# Patient Record
Sex: Male | Born: 1985 | Race: Black or African American | Hispanic: No | Smoking: Never smoker
Health system: Southern US, Community
[De-identification: ages and names within clinical notes are randomized; demographics above are authoritative.]

## PROBLEM LIST (undated history)

## (undated) HISTORY — PX: OTHER SURGICAL HISTORY: SHX169

## (undated) HISTORY — PX: LEG SURGERY: SHX1003

---
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
## 2004-04-11 ENCOUNTER — Emergency Department (HOSPITAL_COMMUNITY): Admission: EM | Admit: 2004-04-11 | Discharge: 2004-04-11 | Payer: Self-pay | Admitting: Emergency Medicine

## 2005-04-03 ENCOUNTER — Emergency Department (HOSPITAL_COMMUNITY): Admission: EM | Admit: 2005-04-03 | Discharge: 2005-04-03 | Payer: Self-pay | Admitting: Emergency Medicine

## 2005-04-09 ENCOUNTER — Emergency Department (HOSPITAL_COMMUNITY): Admission: EM | Admit: 2005-04-09 | Discharge: 2005-04-09 | Payer: Self-pay | Admitting: Emergency Medicine

## 2005-06-10 ENCOUNTER — Emergency Department (HOSPITAL_COMMUNITY): Admission: EM | Admit: 2005-06-10 | Discharge: 2005-06-11 | Payer: Self-pay | Admitting: Emergency Medicine

## 2006-05-25 ENCOUNTER — Emergency Department (HOSPITAL_COMMUNITY): Admission: EM | Admit: 2006-05-25 | Discharge: 2006-05-25 | Payer: Self-pay | Admitting: Emergency Medicine

## 2008-02-19 ENCOUNTER — Emergency Department (HOSPITAL_COMMUNITY): Admission: EM | Admit: 2008-02-19 | Discharge: 2008-02-19 | Payer: Self-pay | Admitting: Emergency Medicine

## 2008-06-02 ENCOUNTER — Emergency Department (HOSPITAL_COMMUNITY): Admission: EM | Admit: 2008-06-02 | Discharge: 2008-06-02 | Payer: Self-pay | Admitting: Internal Medicine

## 2009-05-16 ENCOUNTER — Emergency Department (HOSPITAL_COMMUNITY): Admission: EM | Admit: 2009-05-16 | Discharge: 2009-05-16 | Payer: Self-pay | Admitting: Emergency Medicine

## 2009-07-06 ENCOUNTER — Emergency Department (HOSPITAL_COMMUNITY): Admission: EM | Admit: 2009-07-06 | Discharge: 2009-07-06 | Payer: Self-pay | Admitting: Emergency Medicine

## 2010-03-02 ENCOUNTER — Emergency Department (HOSPITAL_COMMUNITY)
Admission: EM | Admit: 2010-03-02 | Discharge: 2010-03-03 | Payer: Self-pay | Source: Home / Self Care | Admitting: Emergency Medicine

## 2010-04-17 ENCOUNTER — Emergency Department (HOSPITAL_COMMUNITY)
Admission: EM | Admit: 2010-04-17 | Discharge: 2010-04-17 | Payer: Self-pay | Source: Home / Self Care | Admitting: Emergency Medicine

## 2010-04-17 LAB — URINALYSIS, ROUTINE W REFLEX MICROSCOPIC
Ketones, ur: NEGATIVE mg/dL
Leukocytes, UA: NEGATIVE
Nitrite: NEGATIVE
Specific Gravity, Urine: 1.019 (ref 1.005–1.030)
Urine Glucose, Fasting: NEGATIVE mg/dL
Urobilinogen, UA: 1 mg/dL (ref 0.0–1.0)
pH: 6.5 (ref 5.0–8.0)

## 2010-04-17 LAB — DIFFERENTIAL
Eosinophils Relative: 4 % (ref 0–5)
Lymphocytes Relative: 40 % (ref 12–46)
Lymphs Abs: 1.3 10*3/uL (ref 0.7–4.0)
Monocytes Absolute: 0.3 10*3/uL (ref 0.1–1.0)
Neutrophils Relative %: 46 % (ref 43–77)

## 2010-04-17 LAB — COMPREHENSIVE METABOLIC PANEL
ALT: 21 U/L (ref 0–53)
AST: 22 U/L (ref 0–37)
Calcium: 9.4 mg/dL (ref 8.4–10.5)
Chloride: 101 mEq/L (ref 96–112)
GFR calc Af Amer: >60 mL/min (ref >60)
GFR calc non Af Amer: >60 mL/min (ref >60)
Potassium: 4 mEq/L (ref 3.5–5.1)
Sodium: 135 mEq/L (ref 135–145)
Total Bilirubin: 1 mg/dL (ref 0.3–1.2)

## 2010-04-17 LAB — CBC
Hemoglobin: 13.9 g/dL (ref 13.0–17.0)
MCHC: 32.6 g/dL (ref 30.0–36.0)
RDW: 14.1 % (ref 11.5–15.5)

## 2010-06-28 ENCOUNTER — Emergency Department (HOSPITAL_COMMUNITY)
Admission: EM | Admit: 2010-06-28 | Discharge: 2010-06-29 | Disposition: A | Discharge status: Home / Self Care | Payer: No Typology Code available for payment source | Attending: Emergency Medicine | Admitting: Emergency Medicine

## 2010-06-28 DIAGNOSIS — M545 Low back pain, unspecified: Secondary | ICD-10-CM | POA: Insufficient documentation

## 2010-06-28 DIAGNOSIS — S335XXA Sprain of ligaments of lumbar spine, initial encounter: Secondary | ICD-10-CM | POA: Insufficient documentation

## 2010-06-28 DIAGNOSIS — M5380 Other specified dorsopathies, site unspecified: Secondary | ICD-10-CM | POA: Insufficient documentation

## 2010-06-28 DIAGNOSIS — M546 Pain in thoracic spine: Secondary | ICD-10-CM | POA: Insufficient documentation

## 2010-06-29 LAB — URINALYSIS, ROUTINE W REFLEX MICROSCOPIC
Glucose, UA: NEGATIVE mg/dL
Hgb urine dipstick: NEGATIVE
Protein, ur: NEGATIVE mg/dL
Specific Gravity, Urine: 1.022 (ref 1.005–1.030)
Urobilinogen, UA: 1 mg/dL (ref 0.0–1.0)
pH: 7 (ref 5.0–8.0)

## 2010-06-30 LAB — RAPID STREP SCREEN (MED CTR MEBANE ONLY): Streptococcus, Group A Screen (Direct): NEGATIVE
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
## 2010-08-05 NOTE — Op Note (Signed)
NAMEROSHARD, REZABEK                        ACCOUNT NO.:  [id number]   MEDICAL RECORD NO.:  [id number]                   PATIENT TYPE:  INP   LOCATION:  5715                                 FACILITY:  [facility]   PHYSICIAN:  Mark C. Ophelia Charter, M.D.                 DATE OF BIRTH:  Apr 06, 1985   DATE OF PROCEDURE:  12/25/2001  DATE OF DISCHARGE:                                 OPERATIVE REPORT   PREOPERATIVE DIAGNOSES:  Right displaced midshaft radius fracture.   POSTOPERATIVE DIAGNOSES:  Right displaced midshaft radius fracture.   OPERATION PERFORMED:  Compression plating, right midshaft radius fracture.   SURGEON:  Mark C. Ophelia Charter, M.D.   ASSISTANT:  Genene Churn. Denton Meek.   ANESTHESIA:  General.   TOURNIQUET TIME:  51 minutes.   DESCRIPTION OF PROCEDURE:  After induction of general anesthesia,  orotracheal intubation, preoperative Ancef prophylaxis prepping all the way  to the fingertips up to the level of the proximal arm tourniquet.  Usual  impervious sheets, draped, impervious stockinet was applied.  Stockinet was  cut, Betadine Vi-drape applied.  Sterile skin marker was used and a volar  Sherilyn Cooter approach was made.  Interval adjacent to the radial artery was used.  Distally, the pronator was peeled off the bone from its insertion site.  The  superficial radial nerve was identified underneath the brachioradialis and  carefully protected.  Subperiosteal dissection of the proximal fragment was  performed and a Verbrugge clamp was used proximally and distally to hold the  bone, reduce the fracture and then apply a 6-hole Synthes compression  locking plate.  An 18 mm screw was used ________ cortical.  Compression  technique was used.  There was one tiny butterfly piece  at the level of the  fracture and there was one piece of bone that was trimmed slightly and then  impacted into place.  There was anatomic fixation of the proximal and distal  fragments.  Once small piece of bone  comminution was present back on the  dorsal side and was left adjacent to the fracture.  The fracture was  irrigated with saline solution removing hematoma prior to reduction and  plating.  Screw holes were filled.  No interfrag screw was necessary.  There  was anatomic positioning, excellent stability with rotation of the forearm  passively.  After irrigation with saline solution, the radial artery was  checked, tourniquet was deflated.  Small branches off the radial artery had  been cauterized with bipolar cautery.  Superficial radial nerve was intact.  Flexor carpi radialis was allowed to fall to its normal position as well as  the brachioradialis.  The subcutaneous tissue reapproximated with 2-0  Vicryl.  Skin staple closure, Marcaine infiltration of the skin.  Hemovac  drain was placed prior to closure and short arm splint was applied.  Instrument count and needle count was correct.  Tourniquet was deflated  prior  to closure.  Hemostasis was obtained.                                                 Mark C. Ophelia Charter, M.D.   MCY/MEDQ  D:  12/26/2001  T:  12/26/2001  Job:  [id number]

## 2010-11-05 ENCOUNTER — Inpatient Hospital Stay (INDEPENDENT_AMBULATORY_CARE_PROVIDER_SITE_OTHER): Admit: 2010-11-05 | Discharge: 2010-11-05 | Disposition: A | Discharge status: Home / Self Care | Payer: Self-pay

## 2010-11-05 ENCOUNTER — Emergency Department (HOSPITAL_COMMUNITY): Payer: Self-pay

## 2010-11-05 ENCOUNTER — Emergency Department (INDEPENDENT_AMBULATORY_CARE_PROVIDER_SITE_OTHER): Payer: Self-pay

## 2010-11-05 ENCOUNTER — Emergency Department (HOSPITAL_COMMUNITY)
Admission: EM | Admit: 2010-11-05 | Discharge: 2010-11-05 | Discharge status: Against Medical Advice | Payer: Self-pay | Attending: Emergency Medicine | Admitting: Emergency Medicine

## 2010-11-05 DIAGNOSIS — S0180XA Unspecified open wound of other part of head, initial encounter: Secondary | ICD-10-CM

## 2010-11-05 DIAGNOSIS — S1093XA Contusion of unspecified part of neck, initial encounter: Secondary | ICD-10-CM

## 2010-11-05 DIAGNOSIS — IMO0002 Reserved for concepts with insufficient information to code with codable children: Secondary | ICD-10-CM | POA: Insufficient documentation

## 2010-12-15 ENCOUNTER — Emergency Department (HOSPITAL_COMMUNITY)
Admission: EM | Admit: 2010-12-15 | Discharge: 2010-12-15 | Disposition: A | Discharge status: Home / Self Care | Payer: Medicaid Other | Attending: Emergency Medicine | Admitting: Emergency Medicine

## 2010-12-15 ENCOUNTER — Emergency Department (HOSPITAL_COMMUNITY): Payer: Medicaid Other

## 2010-12-15 DIAGNOSIS — M25529 Pain in unspecified elbow: Secondary | ICD-10-CM | POA: Insufficient documentation

## 2010-12-15 DIAGNOSIS — X58XXXA Exposure to other specified factors, initial encounter: Secondary | ICD-10-CM | POA: Insufficient documentation

## 2010-12-15 DIAGNOSIS — M25539 Pain in unspecified wrist: Secondary | ICD-10-CM | POA: Insufficient documentation

## 2010-12-15 DIAGNOSIS — M79609 Pain in unspecified limb: Secondary | ICD-10-CM | POA: Insufficient documentation

## 2010-12-15 DIAGNOSIS — M25519 Pain in unspecified shoulder: Secondary | ICD-10-CM | POA: Insufficient documentation

## 2010-12-15 DIAGNOSIS — S60229A Contusion of unspecified hand, initial encounter: Secondary | ICD-10-CM | POA: Insufficient documentation

## 2010-12-15 DIAGNOSIS — G40909 Epilepsy, unspecified, not intractable, without status epilepticus: Secondary | ICD-10-CM | POA: Insufficient documentation

## 2010-12-15 DIAGNOSIS — IMO0002 Reserved for concepts with insufficient information to code with codable children: Secondary | ICD-10-CM | POA: Insufficient documentation

## 2010-12-15 LAB — DIFFERENTIAL
Basophils Absolute: 0 10*3/uL (ref 0.0–0.1)
Basophils Relative: 0 % (ref 0–1)
Lymphocytes Relative: 23 % (ref 12–46)
Lymphs Abs: 1.2 10*3/uL (ref 0.7–4.0)
Monocytes Absolute: 0.3 10*3/uL (ref 0.1–1.0)
Monocytes Relative: 6 % (ref 3–12)

## 2010-12-15 LAB — POCT I-STAT, CHEM 8
BUN: 6 mg/dL (ref 6–23)
Calcium, Ion: 1.12 mmol/L (ref 1.12–1.32)
Chloride: 107 mEq/L (ref 96–112)
Creatinine, Ser: 1.4 mg/dL — ABNORMAL HIGH (ref 0.50–1.35)
Glucose, Bld: 94 mg/dL (ref 70–99)

## 2010-12-15 LAB — CBC
HCT: 37.4 % — ABNORMAL LOW (ref 39.0–52.0)
MCH: 30.1 pg (ref 26.0–34.0)
MCV: 87.4 fL (ref 78.0–100.0)
RBC: 4.28 MIL/uL (ref 4.22–5.81)

## 2011-04-12 ENCOUNTER — Emergency Department (HOSPITAL_COMMUNITY)
Admission: EM | Admit: 2011-04-12 | Discharge: 2011-04-12 | Disposition: A | Discharge status: Home / Self Care | Payer: Medicaid Other | Attending: Emergency Medicine | Admitting: Emergency Medicine

## 2011-04-12 ENCOUNTER — Encounter (HOSPITAL_COMMUNITY): Payer: Self-pay

## 2011-04-12 ENCOUNTER — Emergency Department (HOSPITAL_COMMUNITY): Payer: Medicaid Other

## 2011-04-12 DIAGNOSIS — S93409A Sprain of unspecified ligament of unspecified ankle, initial encounter: Secondary | ICD-10-CM | POA: Insufficient documentation

## 2011-04-12 DIAGNOSIS — S8391XA Sprain of unspecified site of right knee, initial encounter: Secondary | ICD-10-CM

## 2011-04-12 DIAGNOSIS — M79609 Pain in unspecified limb: Secondary | ICD-10-CM | POA: Insufficient documentation

## 2011-04-12 DIAGNOSIS — M25579 Pain in unspecified ankle and joints of unspecified foot: Secondary | ICD-10-CM | POA: Insufficient documentation

## 2011-04-12 DIAGNOSIS — IMO0002 Reserved for concepts with insufficient information to code with codable children: Secondary | ICD-10-CM | POA: Insufficient documentation

## 2011-04-12 DIAGNOSIS — R209 Unspecified disturbances of skin sensation: Secondary | ICD-10-CM | POA: Insufficient documentation

## 2011-04-12 DIAGNOSIS — S93401A Sprain of unspecified ligament of right ankle, initial encounter: Secondary | ICD-10-CM

## 2011-04-12 DIAGNOSIS — M25569 Pain in unspecified knee: Secondary | ICD-10-CM | POA: Insufficient documentation

## 2011-04-12 MED ORDER — HYDROCODONE-ACETAMINOPHEN 5-325 MG PO TABS
1.0000 | ORAL_TABLET | Freq: Once | ORAL | Status: AC
  Administered 2011-04-12: 1 via ORAL
  Filled 2011-04-12: qty 1

## 2011-04-12 MED ORDER — IBUPROFEN 800 MG PO TABS: 800.0000 mg | ORAL_TABLET | Freq: Three times a day (TID) | ORAL | Status: AC

## 2011-04-12 MED ORDER — HYDROCODONE-ACETAMINOPHEN 5-325 MG PO TABS: 1.0000 | ORAL_TABLET | ORAL | Status: AC | PRN

## 2011-04-12 NOTE — ED Notes (Signed)
Patient transported to X-ray 

## 2011-04-12 NOTE — ED Provider Notes (Signed)
Medical screening examination/treatment/procedure(s) were performed by non-physician practitioner and as supervising physician I was immediately available for consultation/collaboration.    Alanta Scobey R Denaja Verhoeven, MD 04/12/11 1700 

## 2011-04-12 NOTE — ED Notes (Signed)
Rt. Knee / leg pain pain due to kicking a dog that was attempting to bite pt.  No deformity or swelling noted

## 2011-04-12 NOTE — ED Provider Notes (Signed)
History     CSN: [id number]  Arrival date & time 04/12/11  1106   First MD Initiated Contact with Patient 04/12/11 1151      Chief Complaint  Patient presents with  . Leg Pain    (Consider location/radiation/quality/duration/timing/severity/associated sxs/prior treatment) HPI History provided by pt.   Pt was walking down the street today when a dog ran up to him and tried to bite him. He kicked the dog with his right foot and then stomped on it while it was on the ground.  Developed immediate pain in right ankle, lower leg and medial knee.  Aggravated by bearing weight.  Associated w/ numbness in great toe. Otherwise feeling well.  Has h/o right knee surgery.   History reviewed. No pertinent past medical history.  History reviewed. No pertinent past surgical history.  No family history on file.  History  Substance Use Topics  . Smoking status: Current Everyday Smoker  . Smokeless tobacco: Not on file  . Alcohol Use: Yes      Review of Systems  All other systems reviewed and are negative.    Allergies  Review of patient's allergies indicates no known allergies.  Home Medications  No current outpatient prescriptions on file.  BP 137/66  Pulse 77  Temp(Src) 97.8 F (36.6 C) (Oral)  Resp 20  Ht 5\' 7"  (1.702 m)  Wt 165 lb (74.844 kg)  BMI 25.84 kg/m2  SpO2 100%  Physical Exam  Nursing note and vitals reviewed. Constitutional: He is oriented to person, place, and time. He appears well-developed and well-nourished. No distress.  HENT:  Head: Normocephalic and atraumatic.  Eyes:       Normal appearance  Neck: Normal range of motion.  Musculoskeletal:       RLE w/out deformity, edema or ecchymosis.  Surgical scars on medial and anterior knee. Tenderness entire anterior knee as well as lateral/medial joint line; pt only appears uncomfortable when he is not distracted by conservation, and diffuse ankle tenderness.  No ttp of shaft of tibia/fibula.  Pain w/ passive  flexion of knee and any passive motion of ankle. Decreased sensation in great toe. 2+ DP pulse.   Neurological: He is alert and oriented to person, place, and time.  Psychiatric: He has a normal mood and affect. His behavior is normal.    ED Course  Procedures (including critical care time)  Labs Reviewed - No data to display Dg Ankle Complete Right  04/12/2011  *RADIOLOGY REPORT*  Clinical Data: Ankle pain.  RIGHT ANKLE - COMPLETE 3+ VIEW  Comparison: None.  Findings: No acute bony abnormality.  Specifically, no fracture, subluxation, or dislocation.  Soft tissues are intact.  IMPRESSION: No acute bony abnormality.  Original Report Authenticated By: Cyndie Chime, M.D.   Dg Knee Complete 4 Views Right  04/12/2011  *RADIOLOGY REPORT*  Clinical Data: Injury, knee pain.  RIGHT KNEE - COMPLETE 4+ VIEW  Comparison: None.  Findings: Postoperative changes in the right knee.  Moderate degenerative changes with spurring in the lateral and medial compartments.  Small joint effusion.  No acute fracture, subluxation or dislocation.  IMPRESSION: Degenerative and postsurgical changes.  Small joint effusion.  No acute findings.  Original Report Authenticated By: Cyndie Chime, M.D.     1. Sprain of right knee   2. Sprain of right ankle       MDM  Pt kicked a dog defensively today and presents w/ pain in right ankle and knee.  Xrays neg for fx/dislocation.  Results discussed w/ pt.  Prescribed ibuprofen and short course of vicodin and recommended ice, elevation, rest.  Ortho tech provided w/ knee sleeve and crutches.  Referred to ortho for persistent sx.          Otilio Miu, [place] 04/12/11 1407

## 2011-04-12 NOTE — Progress Notes (Signed)
Orthopedic Tech Progress Note Patient Details:  Johnny Turner 1986-02-13 [id number]  Other Ortho Devices Type of Ortho Device: Knee Sleeve;Crutches Ortho Device Location: right knee Ortho Device Interventions: Application   Gaye Pollack 04/12/2011, 2:11 PM

## 2011-05-03 ENCOUNTER — Emergency Department (HOSPITAL_COMMUNITY): Payer: Medicaid Other

## 2011-05-03 ENCOUNTER — Encounter (HOSPITAL_COMMUNITY): Payer: Self-pay | Admitting: Emergency Medicine

## 2011-05-03 ENCOUNTER — Emergency Department (HOSPITAL_COMMUNITY)
Admission: EM | Admit: 2011-05-03 | Discharge: 2011-05-03 | Disposition: A | Discharge status: Home / Self Care | Payer: Medicaid Other | Attending: Emergency Medicine | Admitting: Emergency Medicine

## 2011-05-03 DIAGNOSIS — IMO0002 Reserved for concepts with insufficient information to code with codable children: Secondary | ICD-10-CM | POA: Insufficient documentation

## 2011-05-03 DIAGNOSIS — M79609 Pain in unspecified limb: Secondary | ICD-10-CM | POA: Insufficient documentation

## 2011-05-03 DIAGNOSIS — S62329A Displaced fracture of shaft of unspecified metacarpal bone, initial encounter for closed fracture: Secondary | ICD-10-CM | POA: Insufficient documentation

## 2011-05-03 DIAGNOSIS — S62309A Unspecified fracture of unspecified metacarpal bone, initial encounter for closed fracture: Secondary | ICD-10-CM

## 2011-05-03 DIAGNOSIS — M7989 Other specified soft tissue disorders: Secondary | ICD-10-CM | POA: Insufficient documentation

## 2011-05-03 DIAGNOSIS — F17200 Nicotine dependence, unspecified, uncomplicated: Secondary | ICD-10-CM | POA: Insufficient documentation

## 2011-05-03 MED ORDER — IBUPROFEN 800 MG PO TABS
800.0000 mg | ORAL_TABLET | Freq: Once | ORAL | Status: AC
  Administered 2011-05-03: 800 mg via ORAL
  Filled 2011-05-03: qty 1

## 2011-05-03 MED ORDER — HYDROCODONE-ACETAMINOPHEN 5-325 MG PO TABS
1.0000 | ORAL_TABLET | Freq: Once | ORAL | Status: AC
  Administered 2011-05-03: 1 via ORAL
  Filled 2011-05-03: qty 1

## 2011-05-03 MED ORDER — TETANUS-DIPHTH-ACELL PERTUSSIS 5-2.5-18.5 LF-MCG/0.5 IM SUSP
0.5000 mL | Freq: Once | INTRAMUSCULAR | Status: AC
  Administered 2011-05-03: 0.5 mL via INTRAMUSCULAR
  Filled 2011-05-03: qty 0.5

## 2011-05-03 MED ORDER — HYDROCODONE-ACETAMINOPHEN 5-325 MG PO TABS: 1.0000 | ORAL_TABLET | Freq: Four times a day (QID) | ORAL | Status: AC | PRN
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
## 2011-05-03 NOTE — Progress Notes (Signed)
Orthopedic Tech Progress Note Patient Details:  Johnny Turner 01-May-1985 [id number]  Type of Splint: Short Arm Splint Location: right hand ulnar gutter Splint Interventions: Application    Gaye Pollack 05/03/2011, 1:51 PM

## 2011-05-03 NOTE — ED Notes (Signed)
Ortho notified of splint order.  Patient calling friend for ride home.
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
## 2011-05-03 NOTE — Discharge Instructions (Signed)
Hand Fracture, Metacarpals  Fractures of metacarpals are breaks in the bones of the hand. They extend from the knuckles to the wrist. These bones can undergo many types of fractures. There are different ways of treating these fractures, all of which may be correct.  TREATMENT   Hand fractures can be treated with:    Non-reduction - The fracture is casted without changing the positions of the fracture (bone pieces) involved. This fracture is usually left in a cast for 4 to 6 weeks or as your caregiver thinks necessary.   Closed reduction - The bones are moved back into position without surgery and then casted.   ORIF (open reduction and internal fixation) - The fracture site is opened and the bone pieces are fixed into place with some type of hardware, such as screws, etc. They are then casted.  Your caregiver will discuss the type of fracture you have and the treatment that should be best for that problem. If surgery is chosen, let your caregivers know about the following.   LET YOUR CAREGIVERS KNOW ABOUT:   Allergies.   Medications you are taking, including herbs, eye drops, over the counter medications, and creams.   Use of steroids (by mouth or creams).   Previous problems with anesthetics or novocaine.   Possibility of pregnancy.   History of blood clots (thrombophlebitis).   History of bleeding or blood problems.   Previous surgeries.   Other health problems.  AFTER THE PROCEDURE  After surgery, you will be taken to the recovery area where a nurse will watch and check your progress. Once you are awake, stable, and taking fluids well, barring other problems, you'll be allowed to go home. Once home, an ice pack applied to your operative site may help with pain and keep the swelling down.  HOME CARE INSTRUCTIONS    Follow your caregiver's instructions as to activities, exercises, physical therapy, and driving a car.   Daily exercise is helpful for keeping range of motion and strength. Exercise as  instructed.   To lessen swelling, keep the injured hand elevated above the level of your heart as much as possible.   Apply ice to the injury for 15 to 20 minutes each hour while awake for the first 2 days. Put the ice in a plastic bag and place a thin towel between the bag of ice and your cast.   Move the fingers of your casted hand several times a day.   If a plaster or fiberglass cast was applied:   Do not try to scratch the skin under the cast using a sharp or pointed object.   Check the skin around the cast every day. You may put lotion on red or sore areas.   Keep your cast dry. Your cast can be protected during bathing with a plastic bag. Do not put your cast into the water.   If a plaster splint was applied:   Wear your splint for as long as directed by your caregiver or until seen again.   Do not get your splint wet. Protect it during bathing with a plastic bag.   You may loosen the elastic bandage around the splint if your fingers start to get numb, tingle, get cold or turn blue.   Do not put pressure on your cast or splint; this may cause it to break. Especially, do not lean plaster casts on hard surfaces for 24 hours after application.   Take medications as directed by your caregiver.     discomfort, or fever as directed by your caregiver.   Follow-up as provided by your caregiver. This is very important in order to avoid permanent injury or disability and chronic pain.  SEEK MEDICAL CARE IF:   Increased bleeding (more than a small spot) from beneath your cast or splint if there is beneath the cast as with an open reduction.   Redness, swelling, or increasing pain in the wound or from beneath your cast or splint.   Pus coming from wound or from beneath your cast or splint.   An unexplained oral temperature above 102 F (38.9 C) develops, or as your caregiver suggests.   A foul smell coming  from the wound or dressing or from beneath your cast or splint.   You have a problem moving any of your fingers.  SEEK IMMEDIATE MEDICAL CARE IF:   You develop a rash   You have difficulty breathing   You have any allergy problems  If you do not have a window in your cast for observing the wound, a discharge or minor bleeding may show up as a stain on the outside of your cast. Report these findings to your caregiver. MAKE SURE YOU:   Understand these instructions.   Will watch your condition.   Will get help right away if you are not doing well or get worse.  Document Released: 03/06/2005 Document Revised: 11/16/2010 Document Reviewed: 10/24/2007 [facility] Patient Information 2012 [place], [place].

## 2012-02-08 ENCOUNTER — Encounter (HOSPITAL_COMMUNITY): Payer: Self-pay | Admitting: Emergency Medicine

## 2012-02-08 ENCOUNTER — Emergency Department (HOSPITAL_COMMUNITY)
Admission: EM | Admit: 2012-02-08 | Discharge: 2012-02-08 | Disposition: A | Discharge status: Home / Self Care | Payer: Medicaid Other | Attending: Emergency Medicine | Admitting: Emergency Medicine

## 2012-02-08 ENCOUNTER — Emergency Department (HOSPITAL_COMMUNITY): Payer: Medicaid Other

## 2012-02-08 DIAGNOSIS — M25569 Pain in unspecified knee: Secondary | ICD-10-CM | POA: Insufficient documentation

## 2012-02-08 DIAGNOSIS — IMO0002 Reserved for concepts with insufficient information to code with codable children: Secondary | ICD-10-CM | POA: Insufficient documentation

## 2012-02-08 DIAGNOSIS — Y929 Unspecified place or not applicable: Secondary | ICD-10-CM | POA: Insufficient documentation

## 2012-02-08 DIAGNOSIS — M25561 Pain in right knee: Secondary | ICD-10-CM

## 2012-02-08 DIAGNOSIS — X500XXA Overexertion from strenuous movement or load, initial encounter: Secondary | ICD-10-CM | POA: Insufficient documentation

## 2012-02-08 DIAGNOSIS — Y9389 Activity, other specified: Secondary | ICD-10-CM | POA: Insufficient documentation

## 2012-02-08 DIAGNOSIS — F17200 Nicotine dependence, unspecified, uncomplicated: Secondary | ICD-10-CM | POA: Insufficient documentation

## 2012-02-08 MED ORDER — OXYCODONE-ACETAMINOPHEN 5-325 MG PO TABS
2.0000 | ORAL_TABLET | Freq: Once | ORAL | Status: AC
  Administered 2012-02-08: 2 via ORAL
  Filled 2012-02-08: qty 2

## 2012-02-08 MED ORDER — OXYCODONE-ACETAMINOPHEN 5-325 MG PO TABS: 2.0000 | ORAL_TABLET | ORAL | Status: DC | PRN

## 2012-02-08 NOTE — Discharge Instructions (Signed)
Your x-rays do not show any broken, or dislocated bones.  There is a small effusion, which is the same as you had in January.  Use the knee immobilizer and crutches for stability and to reduce pain.  Use Percocet for pain.  Followup with your Dr. next week.  If your symptoms persist.  Return for worse or uncontrolled symptoms
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
## 2012-02-08 NOTE — ED Notes (Signed)
Patient transported to X-ray 
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
## 2012-02-08 NOTE — ED Notes (Signed)
Ortho notified for left knee immobilizer and crutches

## 2012-02-08 NOTE — Progress Notes (Signed)
Orthopedic Tech Progress Note Patient Details:  Johnny Turner 12/17/1985 [id number]  Ortho Devices Type of Ortho Device: Crutches;Knee Immobilizer Ortho Device/Splint Location: (R) LE Ortho Device/Splint Interventions: Application   Jennye Moccasin 02/08/2012, 8:38 PM

## 2012-04-03 ENCOUNTER — Emergency Department (HOSPITAL_COMMUNITY)
Admission: EM | Admit: 2012-04-03 | Discharge: 2012-04-03 | Disposition: A | Discharge status: Home / Self Care | Payer: Medicaid Other | Attending: Emergency Medicine | Admitting: Emergency Medicine

## 2012-04-03 ENCOUNTER — Encounter (HOSPITAL_COMMUNITY): Payer: Self-pay | Admitting: *Deleted

## 2012-04-03 ENCOUNTER — Emergency Department (HOSPITAL_COMMUNITY): Payer: Medicaid Other

## 2012-04-03 DIAGNOSIS — F17200 Nicotine dependence, unspecified, uncomplicated: Secondary | ICD-10-CM | POA: Insufficient documentation

## 2012-04-03 DIAGNOSIS — R059 Cough, unspecified: Secondary | ICD-10-CM | POA: Insufficient documentation

## 2012-04-03 DIAGNOSIS — R05 Cough: Secondary | ICD-10-CM | POA: Insufficient documentation

## 2012-04-03 DIAGNOSIS — J209 Acute bronchitis, unspecified: Secondary | ICD-10-CM | POA: Insufficient documentation

## 2012-04-03 DIAGNOSIS — R0602 Shortness of breath: Secondary | ICD-10-CM | POA: Insufficient documentation

## 2012-04-03 LAB — POCT I-STAT, CHEM 8
BUN: 5 mg/dL — ABNORMAL LOW (ref 6–23)
Calcium, Ion: 1.24 mmol/L — ABNORMAL HIGH (ref 1.12–1.23)
Creatinine, Ser: 0.9 mg/dL (ref 0.50–1.35)
Hemoglobin: 16.3 g/dL (ref 13.0–17.0)
Sodium: 141 mEq/L (ref 135–145)
TCO2: 26 mmol/L (ref 0–100)

## 2012-04-03 LAB — CBC WITH DIFFERENTIAL/PLATELET
Basophils Absolute: 0 10*3/uL (ref 0.0–0.1)
Basophils Relative: 0 % (ref 0–1)
Eosinophils Absolute: 0.1 10*3/uL (ref 0.0–0.7)
HCT: 44.6 % (ref 39.0–52.0)
Hemoglobin: 15.3 g/dL (ref 13.0–17.0)
MCH: 31.3 pg (ref 26.0–34.0)
MCHC: 34.3 g/dL (ref 30.0–36.0)
Monocytes Absolute: 0.9 10*3/uL (ref 0.1–1.0)
Monocytes Relative: 15 % — ABNORMAL HIGH (ref 3–12)
RDW: 14.9 % (ref 11.5–15.5)

## 2012-04-03 LAB — POCT I-STAT TROPONIN I: Troponin i, poc: 0 ng/mL (ref 0.00–0.08)

## 2012-04-03 MED ORDER — ALBUTEROL SULFATE HFA 108 (90 BASE) MCG/ACT IN AERS
2.0000 | INHALATION_SPRAY | Freq: Once | RESPIRATORY_TRACT | Status: AC
  Administered 2012-04-03: 2 via RESPIRATORY_TRACT
  Filled 2012-04-03: qty 6.7

## 2012-04-03 MED ORDER — BENZONATATE 100 MG PO CAPS: 100.0000 mg | ORAL_CAPSULE | Freq: Three times a day (TID) | ORAL | Status: AC

## 2012-04-03 MED ORDER — HYDROMORPHONE HCL PF 1 MG/ML IJ SOLN
0.5000 mg | Freq: Once | INTRAMUSCULAR | Status: AC
  Administered 2012-04-03: 0.5 mg via INTRAVENOUS
  Filled 2012-04-03: qty 1

## 2012-04-03 NOTE — ED Notes (Signed)
Pt is here with chest pain that started today.  Pt has cough and sob

## 2012-04-03 NOTE — ED Provider Notes (Signed)
History     CSN: [id number]  Arrival date & time 04/03/12  7829   First MD Initiated Contact with Patient 04/03/12 1039      Chief Complaint  Patient presents with  . Chest Pain  . Cough    (Consider location/radiation/quality/duration/timing/severity/associated sxs/prior treatment) HPI  Johnny Turner is a 27 y.o. male complaining of complaining of acute onset of right sided chest pain, shortness of breath and hemoptysis this a.m. at about 5:30. Patient denies fever, abdominal pain, nausea vomiting, recent immobilization or travel, leg swelling. No prior similar episodes.  History reviewed. No pertinent past medical history.  Past Surgical History  Procedure Date  . Leg surgery     No family history on file.  History  Substance Use Topics  . Smoking status: Current Every Day Smoker  . Smokeless tobacco: Not on file  . Alcohol Use: Yes      Review of Systems  Constitutional: Negative for fever.  Respiratory: Positive for cough and shortness of breath.   Cardiovascular: Positive for chest pain. Negative for leg swelling.  Gastrointestinal: Negative for nausea, vomiting, abdominal pain and diarrhea.  All other systems reviewed and are negative.    Allergies  Hydrocodone  Home Medications   Current Outpatient Rx  Name  Route  Sig  Dispense  Refill  . OXYCODONE-ACETAMINOPHEN 5-325 MG PO TABS   Oral   Take 2 tablets by mouth every 4 (four) hours as needed for pain.   20 tablet   0     BP 135/65  Pulse 63  Temp 98.2 F (36.8 C) (Oral)  Resp 6  SpO2 99%  Physical Exam  Nursing note and vitals reviewed. Constitutional: He is oriented to person, place, and time. He appears well-developed and well-nourished. No distress.  HENT:  Head: Normocephalic.  Mouth/Throat: Oropharynx is clear and moist.  Eyes: Conjunctivae normal and EOM are normal. Pupils are equal, round, and reactive to light.  Neck: Normal range of motion. Neck supple. No JVD present.    Cardiovascular: Normal rate, regular rhythm and intact distal pulses.   Pulmonary/Chest: Effort normal and breath sounds normal. No stridor. No respiratory distress. He has no wheezes. He has no rales. He exhibits tenderness.       Tender to palpation of the right inferior anterior chest pain  Abdominal: Soft. Bowel sounds are normal. He exhibits no distension and no mass. There is no tenderness. There is no rebound and no guarding.  Musculoskeletal: Normal range of motion. He exhibits no edema and no tenderness.       No edema, calf asymmetry, superficial collaterals, palpable cords,  Homans negative bilaterally  Neurological: He is alert and oriented to person, place, and time.  Psychiatric: He has a normal mood and affect.    ED Course  Procedures (including critical care time)  Labs Reviewed  POCT I-STAT, CHEM 8 - Abnormal; Notable for the following:    BUN 5 (*)     Calcium, Ion 1.24 (*)     All other components within normal limits  CBC WITH DIFFERENTIAL - Abnormal; Notable for the following:    Monocytes Relative 15 (*)     All other components within normal limits  POCT I-STAT TROPONIN I  D-DIMER, QUANTITATIVE   Dg Chest 2 View  04/03/2012  *RADIOLOGY REPORT*  Clinical Data: Chest pain and cough  CHEST - 2 VIEW  Comparison: None.  Findings: Lungs clear.  Heart size and pulmonary vascularity are normal.  No adenopathy.  No pneumothorax.  No bone lesions.  IMPRESSION: No abnormality noted.   Original Report Authenticated By: Bretta Bang, M.D.     Date: 04/03/2012  Rate: 69  Rhythm: normal sinus rhythm  QRS Axis: normal  Intervals: normal  ST/T Wave abnormalities: normal  Conduction Disutrbances:none  Narrative Interpretation:   Old EKG Reviewed: unchanged  1. Acute bronchitis       MDM  Chest x-ray is negative, d-dimer negative, EKG nonischemic and blood work shows no abnormalities. This is likely secondary to a bronchitis. Patient will be treated  symptomatically.   Pt verbalized understanding and agrees with care plan. Outpatient follow-up and return precautions given.    New Prescriptions   BENZONATATE (TESSALON) 100 MG CAPSULE    Take 1 capsule (100 mg total) by mouth every 8 (eight) hours.          Wynetta Emery, PA-C 04/03/12 1628

## 2012-04-04 NOTE — ED Provider Notes (Signed)
Medical screening examination/treatment/procedure(s) were performed by non-physician practitioner and as supervising physician I was immediately available for consultation/collaboration.   Greenley Martone M Lowen Barringer, MD 04/04/12 0716 

## 2012-10-26 IMAGING — CR DG ELBOW COMPLETE 3+V*R*
4 series · 4 of 4 positions shown · non-contrast
Comparison: Right humerus radiographs performed 02/19/2008

CLINICAL DATA: Right arm pain, status post seizures.

RIGHT ELBOW - COMPLETE 3+ VIEW

[x elbow ap right]
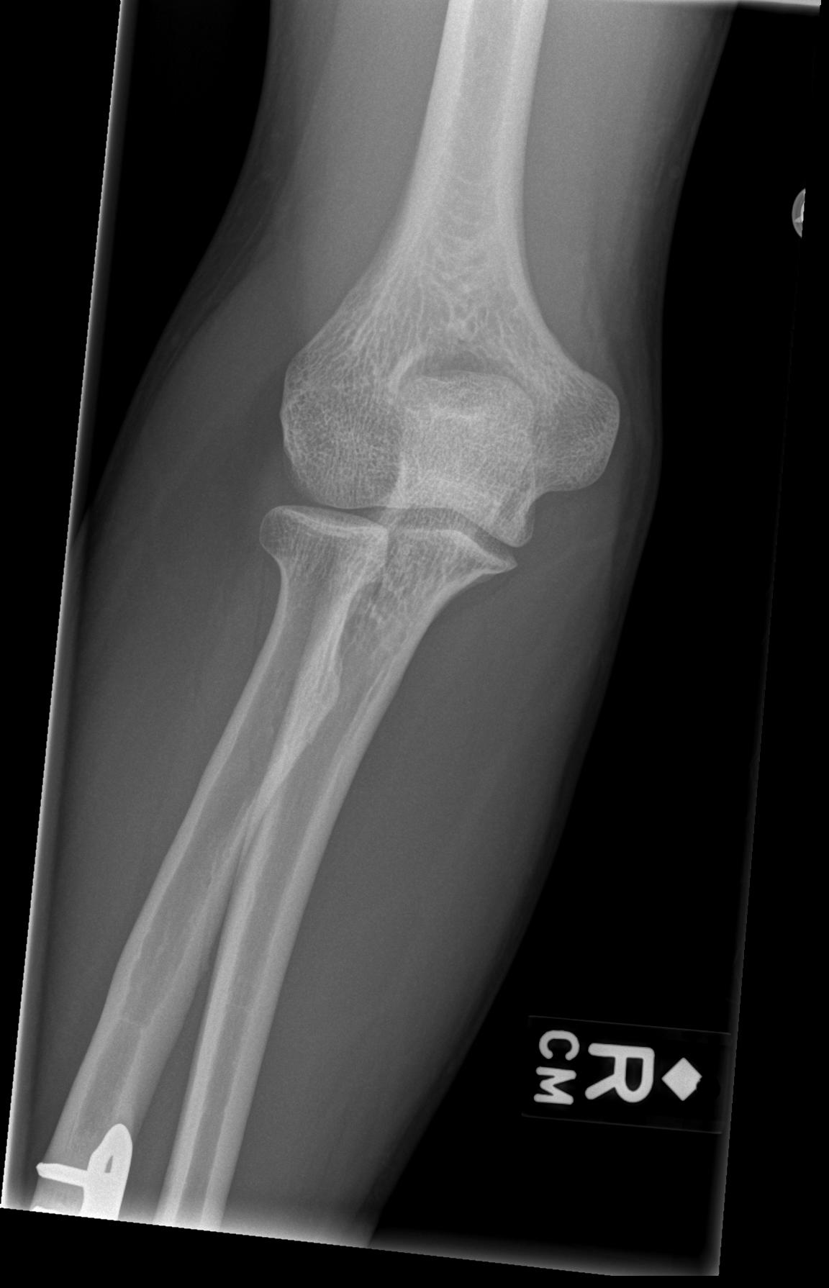

[x elbow obl right (1 of 2)]
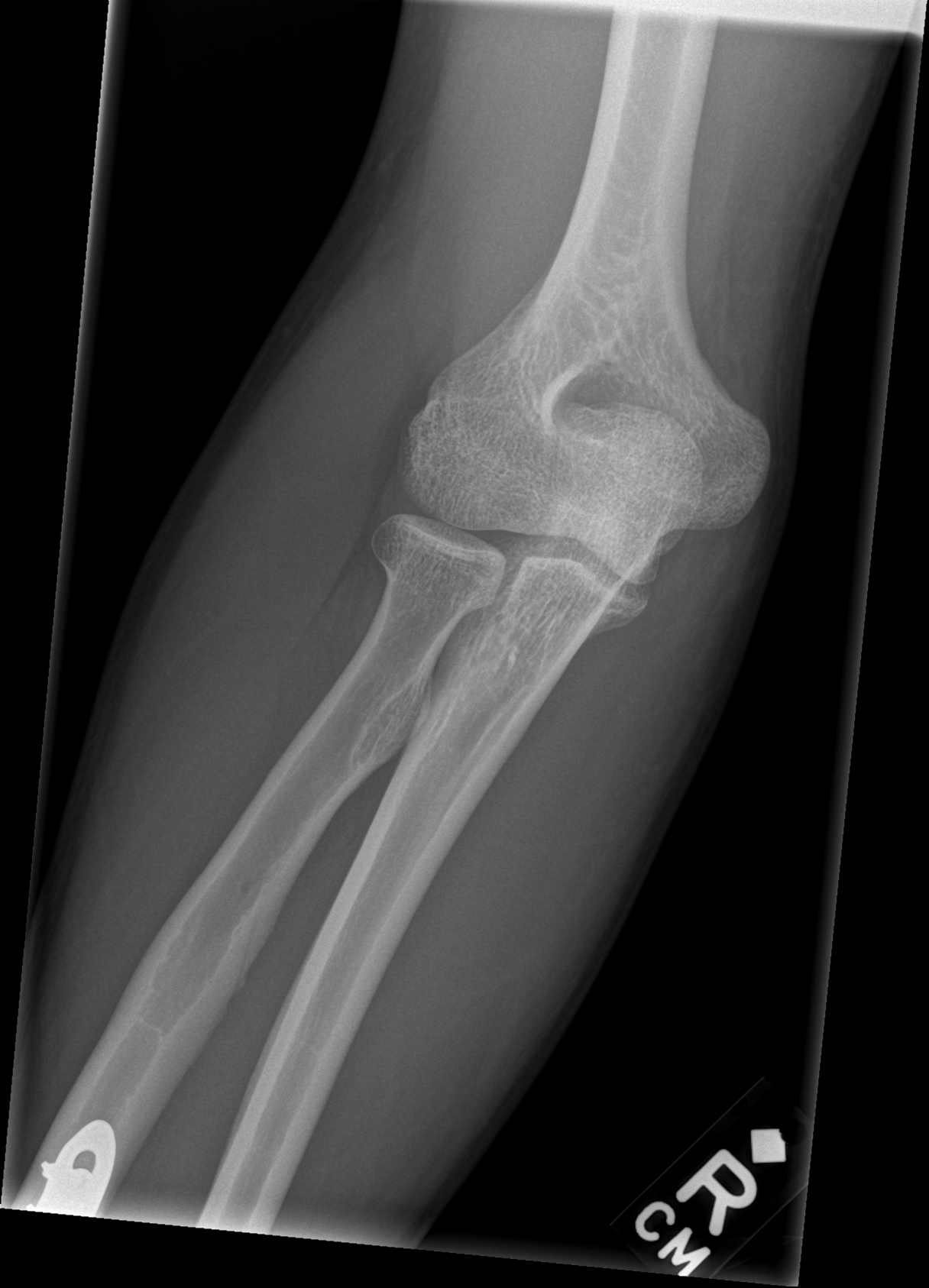

[x elbow obl right (2 of 2)]
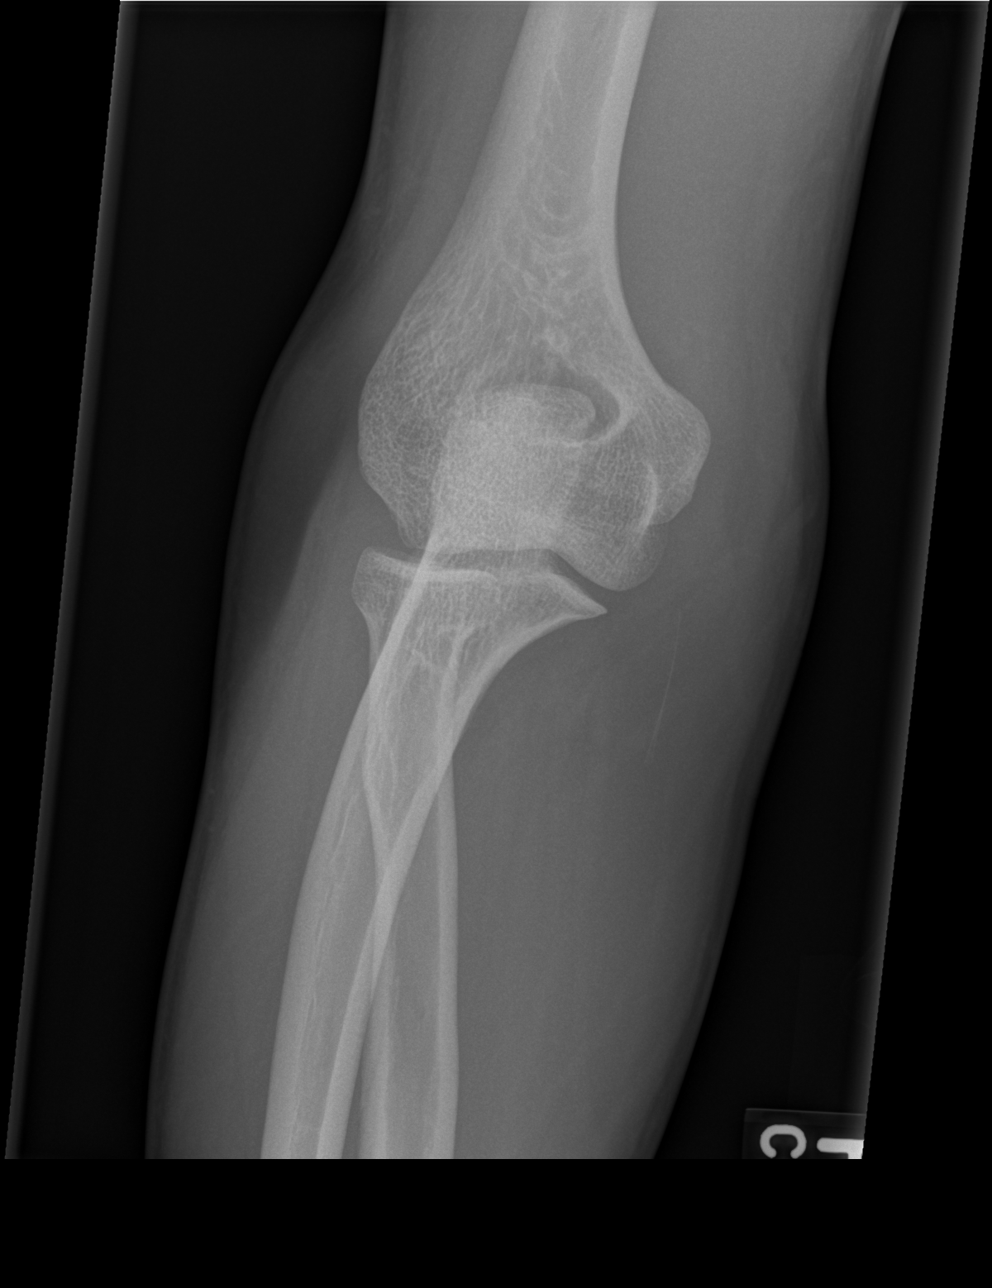

[x elbow lat right]
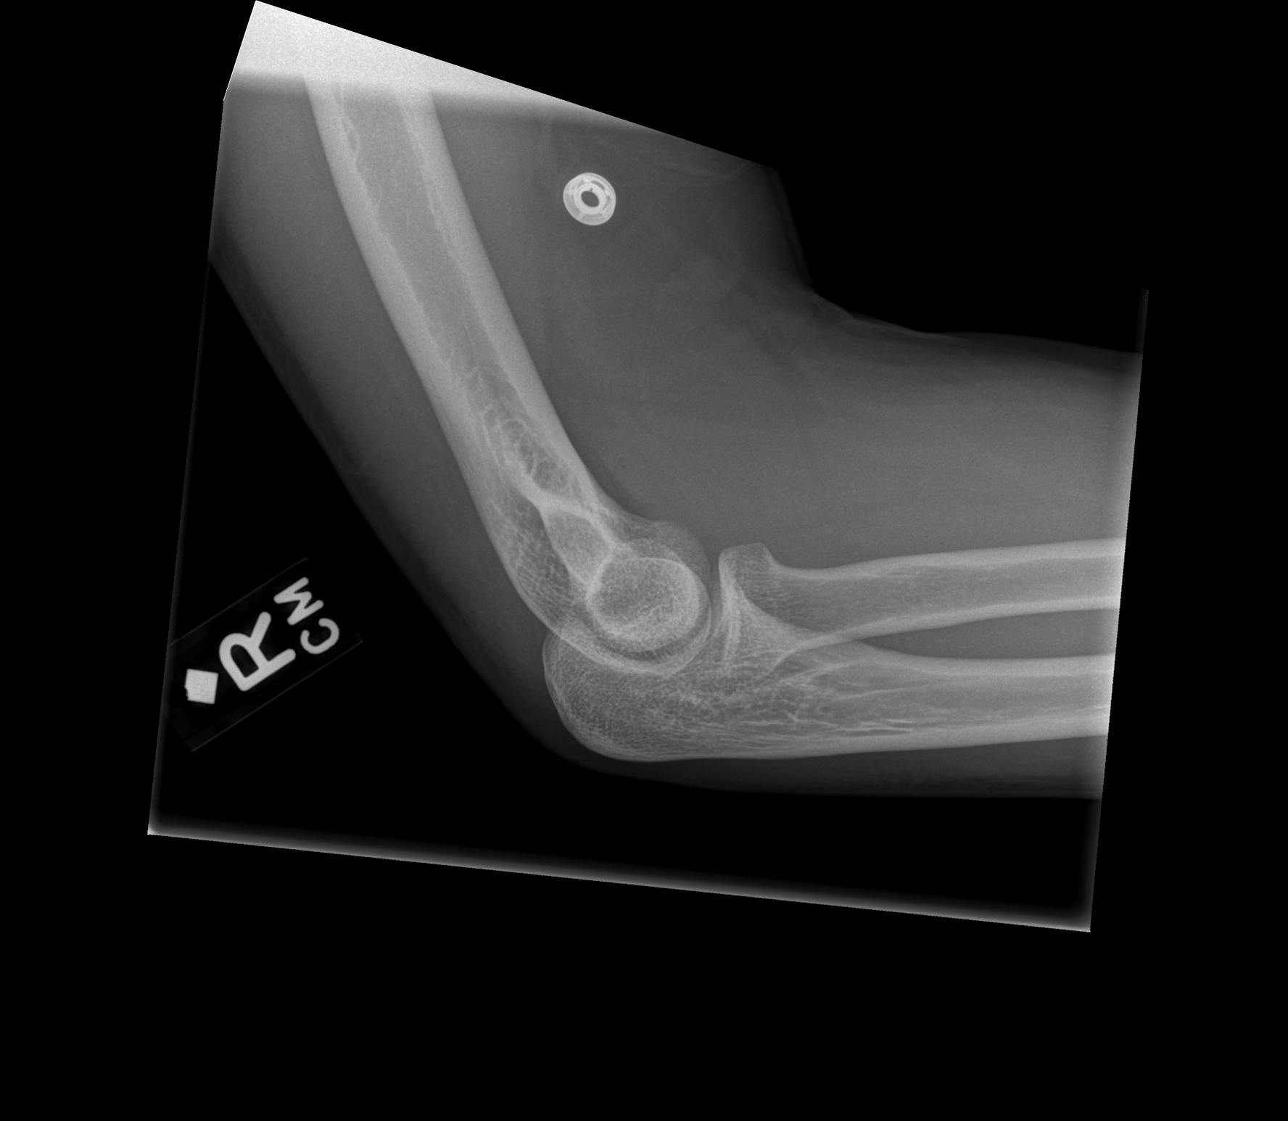

[4 of 4 positions shown; findings below may reference images not displayed]

FINDINGS: There is no evidence of fracture or dislocation.  The
visualized joint spaces are preserved.  No significant joint
effusion is identified.  The soft tissues are unremarkable in
appearance.
IMPRESSION: No evidence of fracture or dislocation.

## 2012-10-26 IMAGING — CT CT HEAD W/O CM
1 of 2 series · 15 of 30 positions shown, 19 images · non-contrast
Comparison: CT of the head performed 02/19/2008

CLINICAL DATA: Seizure; altered mental status.  Amnesia.  Headache.

CT HEAD WITHOUT CONTRAST
TECHNIQUE: Contiguous axial images were obtained from the base of
the skull through the vertex without contrast.

[Series 3: recon 2: brain · axial · 0.49mm/px · z∈[+136,+278]mm · 15 of 64 slices shown, 19 images]
[im 4/64  brain]
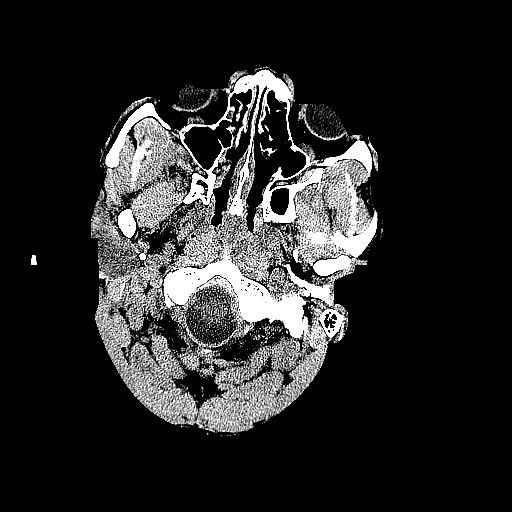
[im 4/64  bone]
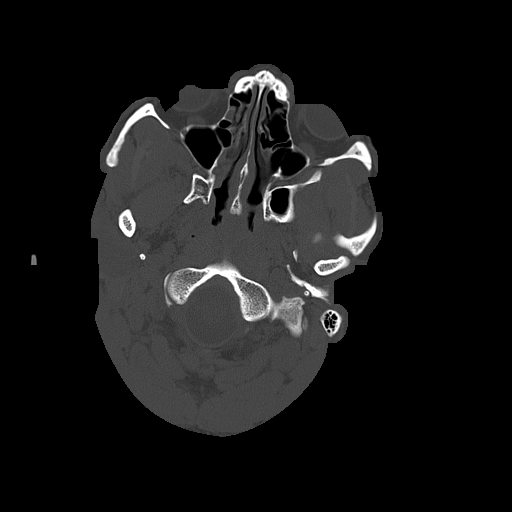
[im 7/64  brain]
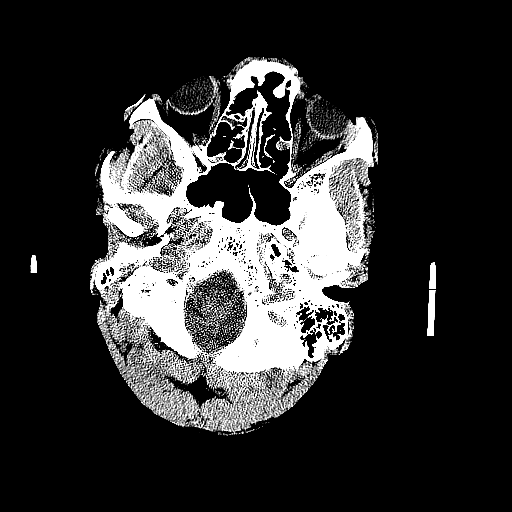
[im 14/64  brain]
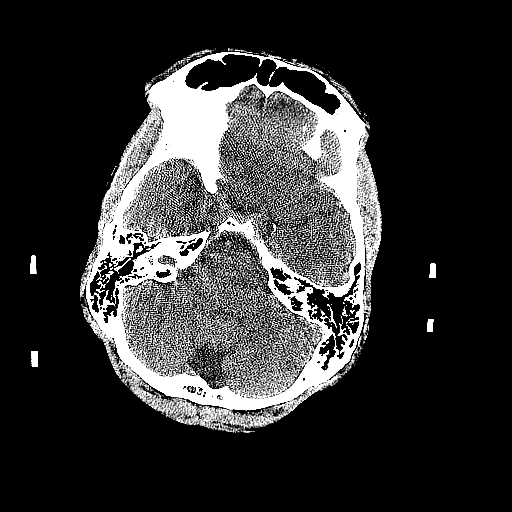
[im 17/64  brain]
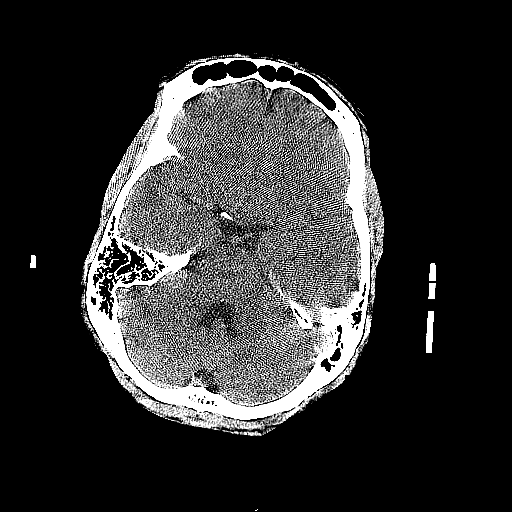
[im 20/64  brain]
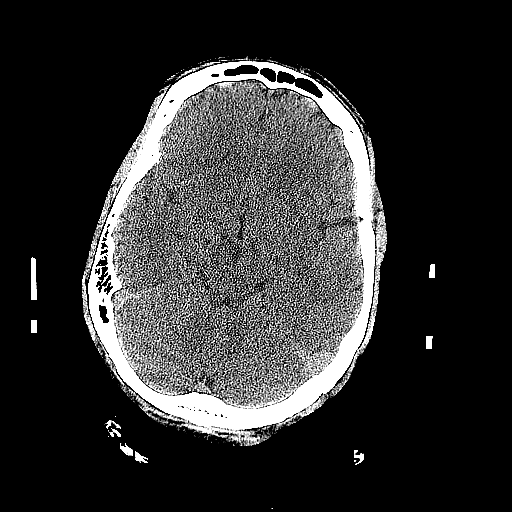
[im 20/64  bone]
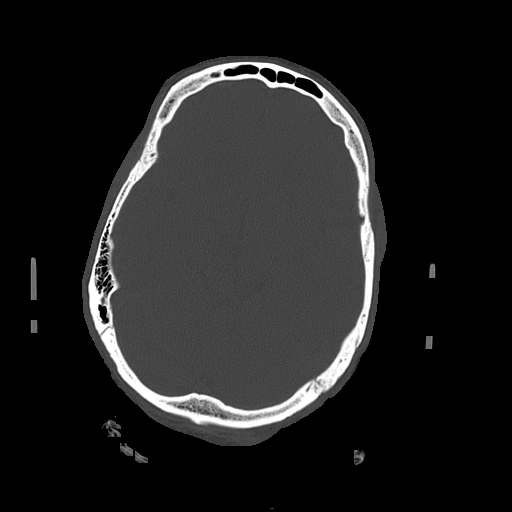
[im 24/64  brain]
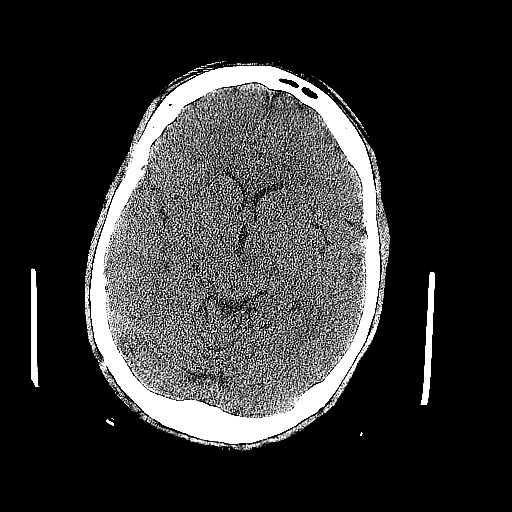
[im 27/64  brain]
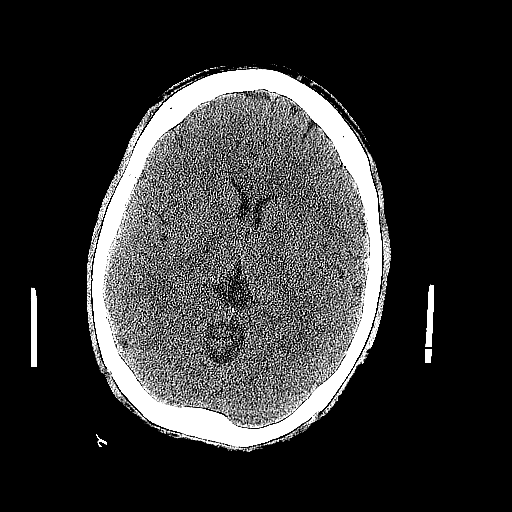
[im 34/64  brain]
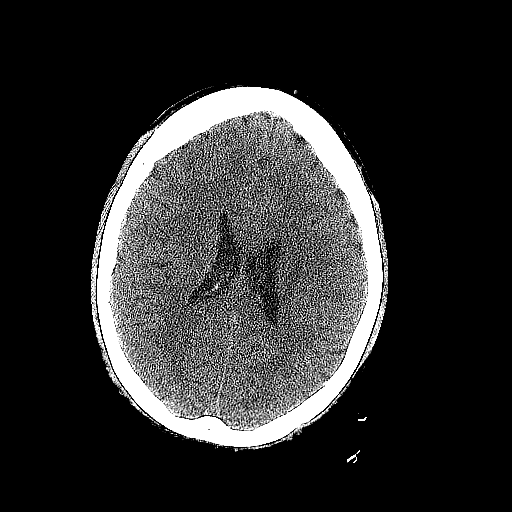
[im 37/64  brain]
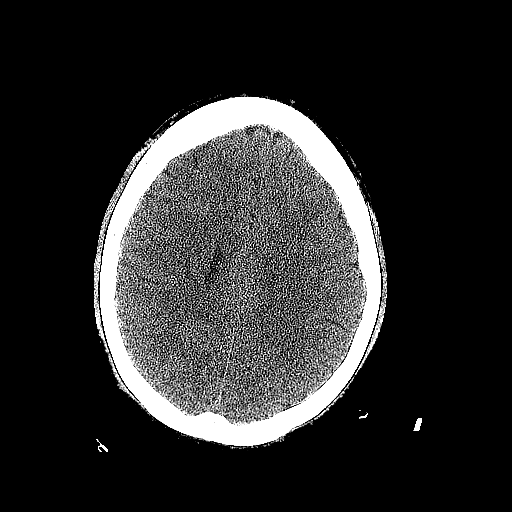
[im 37/64  bone]
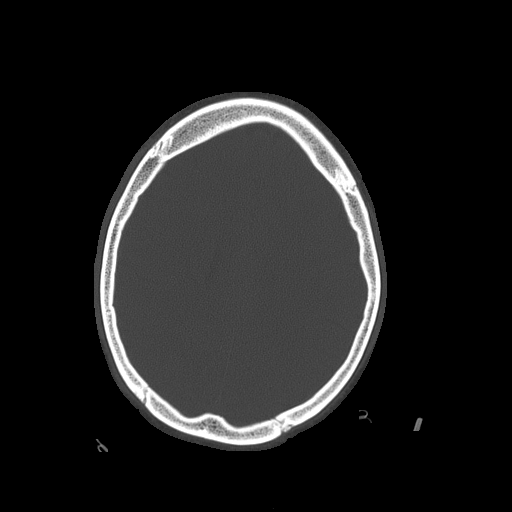
[im 40/64  brain]
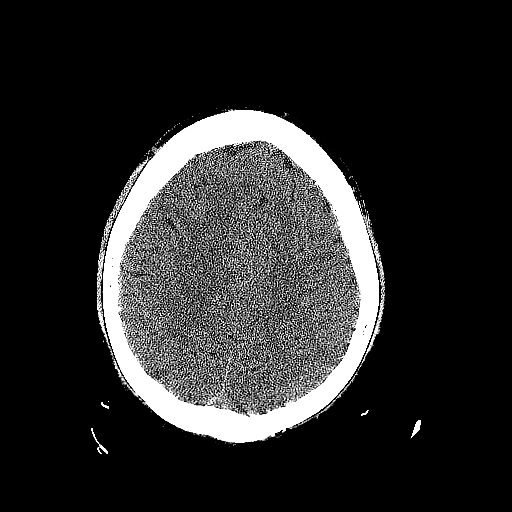
[im 44/64  brain]
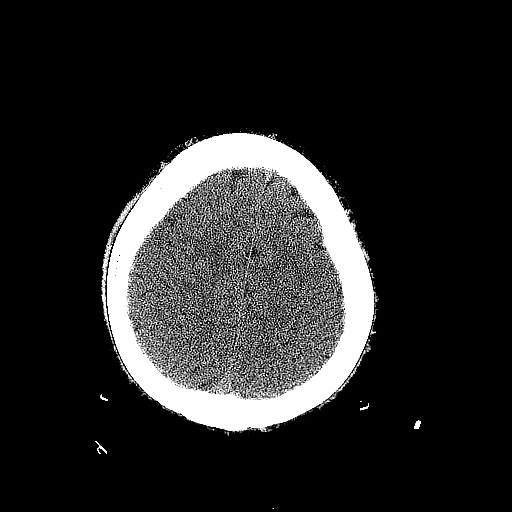
[im 47/64  brain]
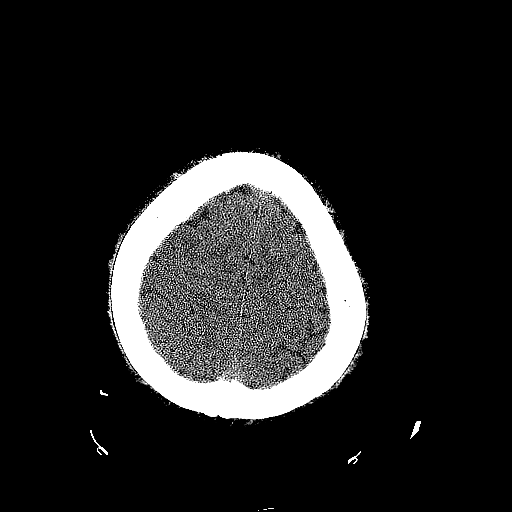
[im 54/64  brain]
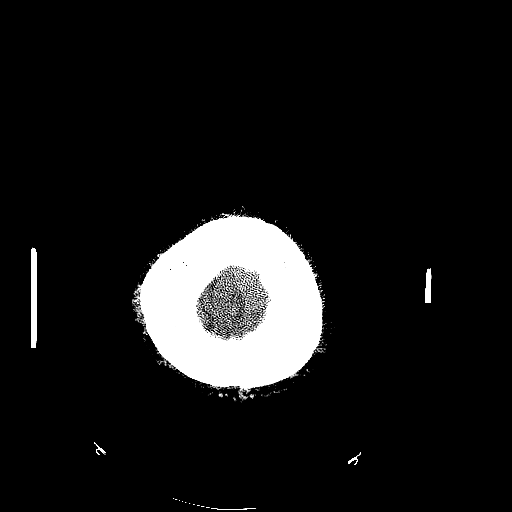
[im 54/64  bone]
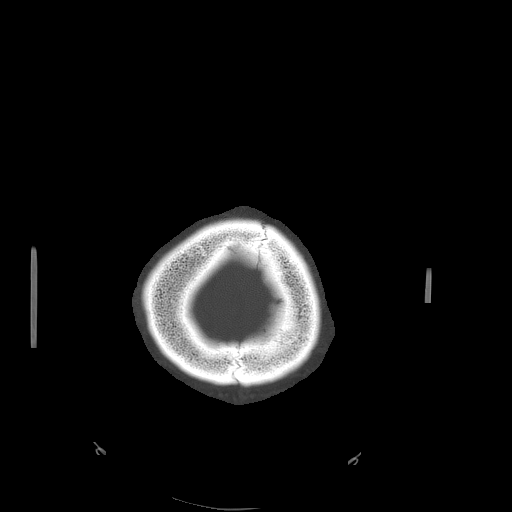
[im 57/64  brain]
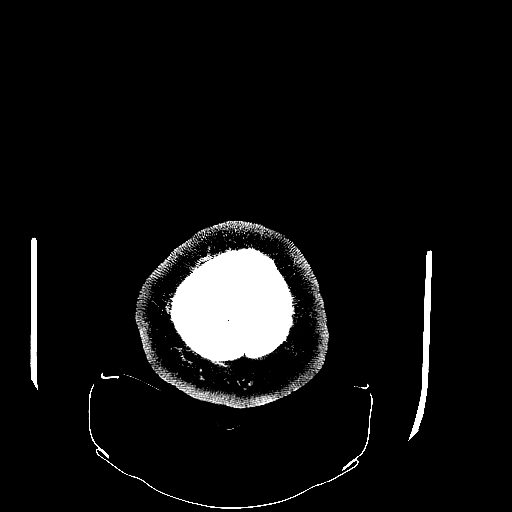
[im 60/64  brain]
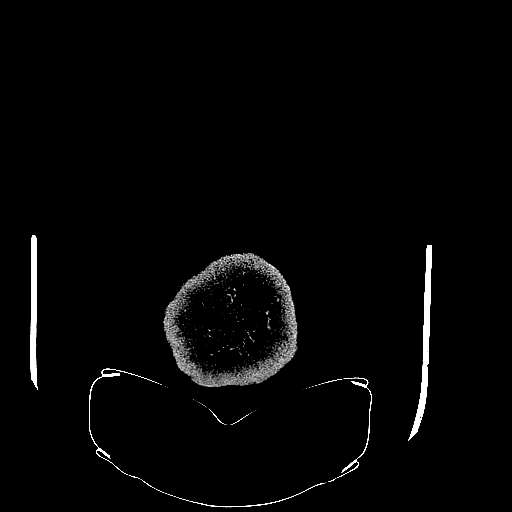

[15 of 30 positions shown; findings below may reference images not displayed]

FINDINGS: There is no evidence of acute infarction, mass lesion, or
intra- or extra-axial hemorrhage on CT.

The posterior fossa, including the cerebellum, brainstem and fourth
ventricle, is within normal limits.  The third and lateral
ventricles, and basal ganglia are unremarkable in appearance.  The
cerebral hemispheres are symmetric in appearance, with normal gray-
white differentiation.  No mass effect or midline shift is seen.

There is no evidence of fracture; visualized osseous structures are
unremarkable in appearance.  The visualized portions of the orbits
are within normal limits.  The paranasal sinuses and mastoid air
cells are well-aerated.  No significant soft tissue abnormalities
are seen.
IMPRESSION: Unremarkable noncontrast CT of the head.

## 2017-03-30 ENCOUNTER — Emergency Department (HOSPITAL_COMMUNITY): Payer: Medicaid Other

## 2017-03-30 ENCOUNTER — Other Ambulatory Visit: Payer: Self-pay

## 2017-03-30 ENCOUNTER — Emergency Department (HOSPITAL_COMMUNITY)
Admission: EM | Admit: 2017-03-30 | Discharge: 2017-03-30 | Discharge status: Against Medical Advice | Payer: Medicaid Other | Attending: Emergency Medicine | Admitting: Emergency Medicine

## 2017-03-30 DIAGNOSIS — Z5321 Procedure and treatment not carried out due to patient leaving prior to being seen by health care provider: Secondary | ICD-10-CM | POA: Diagnosis not present

## 2017-03-30 DIAGNOSIS — R079 Chest pain, unspecified: Secondary | ICD-10-CM | POA: Diagnosis not present

## 2017-03-30 LAB — CBC
HCT: 45.4 % (ref 39.0–52.0)
Hemoglobin: 14.8 g/dL (ref 13.0–17.0)
MCH: 29.5 pg (ref 26.0–34.0)
MCHC: 32.6 g/dL (ref 30.0–36.0)
MCV: 90.4 fL (ref 78.0–100.0)
Platelets: 282 10*3/uL (ref 150–400)
RBC: 5.02 MIL/uL (ref 4.22–5.81)
RDW: 14.6 % (ref 11.5–15.5)
WBC: 5.9 10*3/uL (ref 4.0–10.5)

## 2017-03-30 LAB — BASIC METABOLIC PANEL
Anion gap: 8 (ref 5–15)
BUN: 7 mg/dL (ref 6–20)
CO2: 28 mmol/L (ref 22–32)
Calcium: 9.5 mg/dL (ref 8.9–10.3)
Chloride: 106 mmol/L (ref 101–111)
Creatinine, Ser: 0.92 mg/dL (ref 0.61–1.24)
GFR calc Af Amer: >60 mL/min (ref >60)
GFR calc non Af Amer: >60 mL/min (ref >60)
Glucose, Bld: 71 mg/dL (ref 65–99)
Potassium: 3.9 mmol/L (ref 3.5–5.1)
Sodium: 142 mmol/L (ref 135–145)

## 2017-03-30 NOTE — ED Notes (Signed)
Unable to locate pt x 3

## 2017-03-30 NOTE — ED Triage Notes (Signed)
Pt states he suddenly got chest pain yesterday it starts in the right side of his ribs and goes across chest. Pt states he also has dry cough. Pt denies any sob.

## 2017-03-30 NOTE — ED Notes (Signed)
Called three times for vitals reassessment with no response.

## 2019-10-29 ENCOUNTER — Ambulatory Visit: Payer: Medicaid Other | Admitting: Orthopaedic Surgery

## 2020-07-13 ENCOUNTER — Ambulatory Visit: Payer: Self-pay

## 2020-07-13 ENCOUNTER — Ambulatory Visit (INDEPENDENT_AMBULATORY_CARE_PROVIDER_SITE_OTHER): Payer: Medicaid Other | Admitting: Orthopaedic Surgery

## 2020-07-13 ENCOUNTER — Encounter: Payer: Self-pay | Admitting: Orthopaedic Surgery

## 2020-07-13 VITALS — BP 136/84 | HR 73 | Ht 68.0 in | Wt 166.0 lb

## 2020-07-13 DIAGNOSIS — M1711 Unilateral primary osteoarthritis, right knee: Secondary | ICD-10-CM

## 2020-07-13 DIAGNOSIS — M25561 Pain in right knee: Secondary | ICD-10-CM

## 2020-07-13 DIAGNOSIS — M1710 Unilateral primary osteoarthritis, unspecified knee: Secondary | ICD-10-CM

## 2020-07-13 MED ORDER — LIDOCAINE HCL 1 % IJ SOLN
0.5000 mL | INTRAMUSCULAR | Status: AC | PRN
  Administered 2020-07-13: .5 mL

## 2020-07-13 MED ORDER — METHYLPREDNISOLONE ACETATE 40 MG/ML IJ SUSP
40.0000 mg | INTRAMUSCULAR | Status: AC | PRN
  Administered 2020-07-13: 40 mg via INTRA_ARTICULAR

## 2020-07-13 MED ORDER — BUPIVACAINE HCL 0.25 % IJ SOLN
4.0000 mL | INTRAMUSCULAR | Status: AC | PRN
  Administered 2020-07-13: 4 mL via INTRA_ARTICULAR
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
## 2020-08-03 ENCOUNTER — Ambulatory Visit: Payer: Medicaid Other | Admitting: Orthopaedic Surgery

## 2021-01-10 ENCOUNTER — Emergency Department (HOSPITAL_COMMUNITY)
Admission: EM | Admit: 2021-01-10 | Discharge: 2021-01-10 | Discharge status: Against Medical Advice | Payer: Medicaid Other | Attending: Emergency Medicine | Admitting: Emergency Medicine

## 2021-01-10 DIAGNOSIS — T63444A Toxic effect of venom of bees, undetermined, initial encounter: Secondary | ICD-10-CM

## 2021-01-10 NOTE — ED Notes (Signed)
No answer for triage x2 

## 2021-01-10 NOTE — ED Provider Notes (Signed)
I signed up for this patient to see him in triage for MSE exam. He has left prior to triage process and was no longer in the department when I placed my name on his chart. He may return to the ER at any time.   This chart was dictated using voice recognition software, Dragon. Despite the best efforts of this provider to proofread and correct errors, errors may still occur which can change documentation meaning.    Paris Lore, PA-C 01/10/21 1906    Mancel Bale, MD 01/13/21 [phone]

## 2021-04-28 ENCOUNTER — Encounter (HOSPITAL_BASED_OUTPATIENT_CLINIC_OR_DEPARTMENT_OTHER): Payer: Self-pay | Admitting: Emergency Medicine

## 2021-04-28 ENCOUNTER — Other Ambulatory Visit: Payer: Self-pay

## 2021-04-28 ENCOUNTER — Other Ambulatory Visit (HOSPITAL_BASED_OUTPATIENT_CLINIC_OR_DEPARTMENT_OTHER): Payer: Self-pay

## 2021-04-28 ENCOUNTER — Emergency Department (HOSPITAL_BASED_OUTPATIENT_CLINIC_OR_DEPARTMENT_OTHER): Payer: Medicaid Other

## 2021-04-28 ENCOUNTER — Emergency Department (HOSPITAL_BASED_OUTPATIENT_CLINIC_OR_DEPARTMENT_OTHER)
Admission: EM | Admit: 2021-04-28 | Discharge: 2021-04-28 | Disposition: A | Discharge status: Home / Self Care | Payer: Medicaid Other | Attending: Emergency Medicine | Admitting: Emergency Medicine

## 2021-04-28 DIAGNOSIS — R1032 Left lower quadrant pain: Secondary | ICD-10-CM | POA: Diagnosis present

## 2021-04-28 DIAGNOSIS — R1084 Generalized abdominal pain: Secondary | ICD-10-CM

## 2021-04-28 DIAGNOSIS — F17200 Nicotine dependence, unspecified, uncomplicated: Secondary | ICD-10-CM | POA: Insufficient documentation

## 2021-04-28 DIAGNOSIS — K5900 Constipation, unspecified: Secondary | ICD-10-CM | POA: Insufficient documentation

## 2021-04-28 DIAGNOSIS — Z79899 Other long term (current) drug therapy: Secondary | ICD-10-CM | POA: Diagnosis not present

## 2021-04-28 LAB — COMPREHENSIVE METABOLIC PANEL
ALT: 28 U/L (ref 0–44)
AST: 26 U/L (ref 15–41)
Albumin: 4.4 g/dL (ref 3.5–5.0)
Alkaline Phosphatase: 38 U/L (ref 38–126)
Anion gap: 9 (ref 5–15)
BUN: 12 mg/dL (ref 6–20)
CO2: 25 mmol/L (ref 22–32)
Calcium: 9.7 mg/dL (ref 8.9–10.3)
Chloride: 104 mmol/L (ref 98–111)
Creatinine, Ser: 0.85 mg/dL (ref 0.61–1.24)
GFR, Estimated: >60 mL/min (ref >60)
Glucose, Bld: 104 mg/dL — ABNORMAL HIGH (ref 70–99)
Potassium: 4.1 mmol/L (ref 3.5–5.1)
Sodium: 138 mmol/L (ref 135–145)
Total Bilirubin: 1 mg/dL (ref 0.3–1.2)
Total Protein: 7.4 g/dL (ref 6.5–8.1)

## 2021-04-28 LAB — CBC WITH DIFFERENTIAL/PLATELET
Abs Immature Granulocytes: 0.01 10*3/uL (ref 0.00–0.07)
Basophils Absolute: 0 10*3/uL (ref 0.0–0.1)
Basophils Relative: 1 %
Eosinophils Absolute: 0.2 10*3/uL (ref 0.0–0.5)
Eosinophils Relative: 5 %
HCT: 41.1 % (ref 39.0–52.0)
Hemoglobin: 13.4 g/dL (ref 13.0–17.0)
Immature Granulocytes: 0 %
Lymphocytes Relative: 51 %
Lymphs Abs: 2 10*3/uL (ref 0.7–4.0)
MCH: 29.4 pg (ref 26.0–34.0)
MCHC: 32.6 g/dL (ref 30.0–36.0)
MCV: 90.1 fL (ref 80.0–100.0)
Monocytes Absolute: 0.4 10*3/uL (ref 0.1–1.0)
Monocytes Relative: 10 %
Neutro Abs: 1.3 10*3/uL — ABNORMAL LOW (ref 1.7–7.7)
Neutrophils Relative %: 33 %
Platelets: 321 10*3/uL (ref 150–400)
RBC: 4.56 MIL/uL (ref 4.22–5.81)
RDW: 13.8 % (ref 11.5–15.5)
WBC: 3.8 10*3/uL — ABNORMAL LOW (ref 4.0–10.5)
nRBC: 0 % (ref 0.0–0.2)

## 2021-04-28 LAB — URINALYSIS, ROUTINE W REFLEX MICROSCOPIC
Bilirubin Urine: NEGATIVE
Glucose, UA: NEGATIVE mg/dL
Hgb urine dipstick: NEGATIVE
Ketones, ur: NEGATIVE mg/dL
Leukocytes,Ua: NEGATIVE
Nitrite: NEGATIVE
Specific Gravity, Urine: 1.022 (ref 1.005–1.030)
pH: 8 (ref 5.0–8.0)

## 2021-04-28 LAB — LIPASE, BLOOD: Lipase: 37 U/L (ref 11–51)

## 2021-04-28 MED ORDER — ONDANSETRON HCL 4 MG PO TABS
4.0000 mg | ORAL_TABLET | Freq: Four times a day (QID) | ORAL | 0 refills | Status: AC
  Filled 2021-04-28: qty 12, 3d supply, fill #0

## 2021-04-28 MED ORDER — POLYETHYLENE GLYCOL 3350 17 GM/SCOOP PO POWD
17.0000 g | Freq: Every day | ORAL | 0 refills | Status: AC
  Filled 2021-04-28: qty 238, 7d supply, fill #0

## 2021-04-28 MED ORDER — IOHEXOL 300 MG/ML  SOLN
100.0000 mL | Freq: Once | INTRAMUSCULAR | Status: AC | PRN
  Administered 2021-04-28: 100 mL via INTRAVENOUS

## 2021-04-28 MED ORDER — MORPHINE SULFATE (PF) 4 MG/ML IV SOLN
4.0000 mg | Freq: Once | INTRAVENOUS | Status: AC
  Administered 2021-04-28: 4 mg via INTRAVENOUS
  Filled 2021-04-28: qty 1

## 2021-04-28 MED ORDER — ONDANSETRON HCL 4 MG/2ML IJ SOLN
4.0000 mg | Freq: Once | INTRAMUSCULAR | Status: AC
  Administered 2021-04-28: 4 mg via INTRAVENOUS
  Filled 2021-04-28: qty 2

## 2021-04-28 NOTE — ED Provider Notes (Signed)
[facility] EMERGENC[facility] DEPT Provider Note   CSN: [id number] Arrival date & time: 04/28/21  1033     History  No chief complaint on file.   Johnny Turner is a 36 y.o. male.  The history is provided by the patient. No language interpreter was used.   36 year old male sent here from urgent care center for evaluation of abdominal pain.  Patient reports he developed mid abdominal pain now radiates to his right lower quadrant since yesterday.  Pain is described more as a sharp and knot sensation, moderate in severity.  He endorses nausea, has vomited multiple times today.  Vomiting is nonbloody bilious.  He has not had a bowel movement in 3 days, is usually having bowel movement daily.  However he denies feeling constipated or having the urge to have bowel movement.  He is able to pass gas.  He tries taking Advil as well as ice pack at home without adequate relief.  He does not complain of any fever but does note some chills.  No chest pain shortness of breath productive cough dysuria hematuria or rash.  He still has an intact appendix.  No prior abdominal surgery.  He does endorse decreased appetite.  He denies testicle pain or scrotal swelling.  Home Medications Prior to Admission medications   Medication Sig Start Date End Date Taking? Authorizing Provider  benzonatate (TESSALON) 100 MG capsule Take 1 capsule (100 mg total) by mouth every 8 (eight) hours. Patient not taking: Reported on 04/28/2021 04/03/12   Pisciotta, Elmyra Ricks, PA-C      Allergies    Penicillins and Hydrocodone    Review of Systems   Review of Systems  All other systems reviewed and are negative.  Physical Exam Updated Vital Signs BP 131/83 (BP Location: Right Arm)    Pulse (!) 58    Temp (!) 97.2 F (36.2 C)    Resp 16    Ht 5\' 7"  (1.702 m)    Wt 80.7 kg    SpO2 100%    BMI 27.88 kg/m  Physical Exam Vitals and nursing note reviewed.  Constitutional:      General: He is not in acute distress.     Appearance: He is well-developed.  HENT:     Head: Atraumatic.  Eyes:     Conjunctiva/sclera: Conjunctivae normal.  Cardiovascular:     Rate and Rhythm: Normal rate and regular rhythm.     Pulses: Normal pulses.     Heart sounds: Normal heart sounds.  Pulmonary:     Effort: Pulmonary effort is normal.     Breath sounds: Normal breath sounds. No wheezing, rhonchi or rales.  Abdominal:     Palpations: Abdomen is soft.     Tenderness: There is abdominal tenderness (Tenderness to periumbilical and left lower quadrant on palpation without guarding or rebound tenderness.  Negative Murphy sign). There is no left CVA tenderness.  Musculoskeletal:     Cervical back: Neck supple.  Skin:    Findings: No rash.  Neurological:     Mental Status: He is alert.    ED Results / Procedures / Treatments   Labs (all labs ordered are listed, but only abnormal results are displayed) Labs Reviewed  COMPREHENSIVE METABOLIC PANEL - Abnormal; Notable for the following components:      Result Value   Glucose, Bld 104 (*)    All other components within normal limits  CBC WITH DIFFERENTIAL/PLATELET - Abnormal; Notable for the following components:   WBC 3.8 (*)  Neutro Abs 1.3 (*)    All other components within normal limits  URINALYSIS, ROUTINE W REFLEX MICROSCOPIC - Abnormal; Notable for the following components:   Protein, ur TRACE (*)    All other components within normal limits  LIPASE, BLOOD    EKG None  Radiology CT ABDOMEN PELVIS W CONTRAST  Result Date: 04/28/2021 CLINICAL DATA:  RLQ abdominal pain (Age >= 14y) EXAM: CT ABDOMEN AND PELVIS WITH CONTRAST TECHNIQUE: Multidetector CT imaging of the abdomen and pelvis was performed using the standard protocol following bolus administration of intravenous contrast. RADIATION DOSE REDUCTION: This exam was performed according to the departmental dose-optimization program which includes automated exposure control, adjustment of the mA and/or kV  according to patient size and/or use of iterative reconstruction technique. CONTRAST:  169mL OMNIPAQUE IOHEXOL 300 MG/ML  SOLN COMPARISON:  Most recent prior is from 2012 FINDINGS: Lower chest: No acute abnormality. Hepatobiliary: Too small to characterize low-density lesion of the right hepatic lobe. Gallbladder is unremarkable. No biliary dilatation. Pancreas: Unremarkable. Spleen: Unremarkable. Adrenals/Urinary Tract: Adrenals are unremarkable. Too small to characterize low-density lesion of the lower pole the left kidney. Bladder is unremarkable. Stomach/Bowel: Stomach is within normal limits. Bowel is normal in caliber. Normal appendix. Vascular/Lymphatic: No significant vascular abnormality. No enlarged nodes. Reproductive: Unremarkable. Other: No free fluid.  Abdominal wall is unremarkable. Musculoskeletal: Chronic bilateral L5 pars breaks. Lower lumbar disc bulges. Foraminal narrowing at L5-S1. IMPRESSION: No acute abnormality.  Normal appendix. Electronically Signed   By: Macy Mis M.D.   On: 04/28/2021 14:52    Procedures Procedures    Medications Ordered in ED Medications  ondansetron (ZOFRAN) injection 4 mg (4 mg Intravenous Given 04/28/21 1406)  morphine (PF) 4 MG/ML injection 4 mg (4 mg Intravenous Given 04/28/21 1406)  iohexol (OMNIPAQUE) 300 MG/ML solution 100 mL (100 mLs Intravenous Contrast Given 04/28/21 1433)    ED Course/ Medical Decision Making/ A&P                           Medical Decision Making Amount and/or Complexity of Data Reviewed Labs: ordered. Radiology: ordered.  Risk Prescription drug management.   BP 131/83 (BP Location: Right Arm)    Pulse (!) 58    Temp (!) 97.2 F (36.2 C)    Resp 16    Ht 5\' 7"  (1.702 m)    Wt 80.7 kg    SpO2 100%    BMI 27.88 kg/m   1:49 PM Patient here with complaints of abdominal pain for the past few days and associate nausea and vomiting. Pain is more localized to periumbilical and left lower quadrant on initial exam without  any peritoneal sign.  Given location of his symptoms and his discomfort, will obtain abdominal pelvis CT scan to rule out acute abdominal pathology.  4:22 PM Labs, and imaging independently review and interpret by me.  UA obtained without signs of urinary tract infection, no blood in the urine.  Electrolyte panels are reassuring, normal lipase, white count is 3.88.  An abdominal pelvis CT scan obtained showed no acute abnormalities and normal appendix.  We will discharge home with MiraLAX to help with constipation and Zofran as needed for nausea.  Recommend over-the-counter Tylenol or ibuprofen as needed for pain but return precaution was given if symptoms persist.  Patient voiced understanding and agrees with plan  This patient presents to the ED for concern of abd pain, this involves an extensive number of treatment options, and  is a complaint that carries with it a high risk of complications and morbidity.  The differential diagnosis includes appendicitis, colitis, diverticulitis, kidney stone, UTI, hernia, bowel ischemia, constipation  Co morbidities that complicate the patient evaluation tobacco and alcohol use Additional history obtained:  Additional history obtained from girl friend at bedside External records from outside source obtained and reviewed including none  Lab Tests:  I Ordered, and personally interpreted labs.  The pertinent results include:  reassuring labs results  Imaging Studies ordered:  I ordered imaging studies including abd/pelvis CT I independently visualized and interpreted imaging which showed no acute finding I agree with the radiologist interpretation  Cardiac Monitoring:  The patient was maintained on a cardiac monitor.  I personally viewed and interpreted the cardiac monitored which showed an underlying rhythm of: sinus rhythm  Medicines ordered and prescription drug management:  I ordered medication including morphine  for abd pain Reevaluation of  the patient after these medicines showed that the patient improved I have reviewed the patients home medicines and have made adjustments as needed  Test Considered: as above  Critical Interventions: as above   Problem List / ED Course: abd pain  Reevaluation:  After the interventions noted above, I reevaluated the patient and found that they have :improved  Social Determinants of Health:   Dispostion:  After consideration of the diagnostic results and the patients response to treatment, I feel that the patent would benefit from sxs treatment and outpt f/u.  Return precaution given.         Final Clinical Impression(s) / ED Diagnoses Final diagnoses:  Generalized abdominal pain  Constipation, unspecified constipation type    Rx / DC Orders ED Discharge Orders          Ordered    ondansetron (ZOFRAN) 4 MG tablet  Every 6 hours        04/28/21 1629    polyethylene glycol powder (GLYCOLAX/MIRALAX) 17 GM/SCOOP powder  Daily        04/28/21 1629              Domenic Moras, PA-C 04/28/21 1630    Sherwood Gambler, MD 04/30/21 1006

## 2021-04-28 NOTE — ED Triage Notes (Signed)
Sharp right lower abd pain, vomiting started yesterday. Has not had a BM in 3 days. Urgent care advised he come here to have blood work completed.

## 2021-04-28 NOTE — Discharge Instructions (Signed)
You have been evaluated for your abdominal pain.  Fortunately CT scan obtained today did not show any concerning finding.  No evidence of appendicitis. Take MiraLAX as needed for constipation.  Take Zofran as needed for nausea.  Follow-up with your doctor for further care.  You may take over-the-counter Tylenol or ibuprofen as needed for pain.  Return if you have any concern.

## 2022-04-18 ENCOUNTER — Emergency Department (HOSPITAL_BASED_OUTPATIENT_CLINIC_OR_DEPARTMENT_OTHER): Payer: Medicaid Other | Admitting: Radiology

## 2022-04-18 ENCOUNTER — Other Ambulatory Visit: Payer: Self-pay

## 2022-04-18 ENCOUNTER — Encounter (HOSPITAL_BASED_OUTPATIENT_CLINIC_OR_DEPARTMENT_OTHER): Payer: Self-pay

## 2022-04-18 ENCOUNTER — Emergency Department (HOSPITAL_BASED_OUTPATIENT_CLINIC_OR_DEPARTMENT_OTHER)
Admission: EM | Admit: 2022-04-18 | Discharge: 2022-04-18 | Disposition: A | Discharge status: Home / Self Care | Payer: Medicaid Other | Attending: Emergency Medicine | Admitting: Emergency Medicine

## 2022-04-18 DIAGNOSIS — M25511 Pain in right shoulder: Secondary | ICD-10-CM | POA: Insufficient documentation

## 2022-04-18 DIAGNOSIS — M79641 Pain in right hand: Secondary | ICD-10-CM | POA: Insufficient documentation

## 2022-04-18 DIAGNOSIS — M79604 Pain in right leg: Secondary | ICD-10-CM | POA: Insufficient documentation

## 2022-04-18 DIAGNOSIS — M25551 Pain in right hip: Secondary | ICD-10-CM | POA: Insufficient documentation

## 2022-04-18 DIAGNOSIS — M25531 Pain in right wrist: Secondary | ICD-10-CM | POA: Diagnosis not present

## 2022-04-18 DIAGNOSIS — Y92410 Unspecified street and highway as the place of occurrence of the external cause: Secondary | ICD-10-CM | POA: Diagnosis not present

## 2022-04-18 MED ORDER — OXYCODONE-ACETAMINOPHEN 5-325 MG PO TABS
1.0000 | ORAL_TABLET | Freq: Once | ORAL | Status: DC
Start: 2022-04-18 — End: 2022-04-18

## 2022-04-18 MED ORDER — OXYCODONE-ACETAMINOPHEN 5-325 MG PO TABS
2.0000 | ORAL_TABLET | Freq: Once | ORAL | Status: AC
  Administered 2022-04-18: 2 via ORAL
  Filled 2022-04-18: qty 2

## 2022-04-18 MED ORDER — IBUPROFEN 400 MG PO TABS
600.0000 mg | ORAL_TABLET | Freq: Once | ORAL | Status: AC
  Administered 2022-04-18: 600 mg via ORAL
  Filled 2022-04-18: qty 1

## 2022-04-18 MED ORDER — NAPROXEN 500 MG PO TABS: 500.0000 mg | ORAL_TABLET | Freq: Two times a day (BID) | ORAL | 0 refills | Status: AC
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
## 2022-04-18 NOTE — ED Notes (Signed)
Pt given discharge instructions and reviewed prescriptions. Opportunities given for questions. Pt verbalizes understanding. Leanne Chang, RN
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
## 2023-02-20 ENCOUNTER — Other Ambulatory Visit: Payer: Self-pay

## 2023-02-20 ENCOUNTER — Emergency Department (HOSPITAL_BASED_OUTPATIENT_CLINIC_OR_DEPARTMENT_OTHER): Payer: Medicaid Other | Admitting: Radiology

## 2023-02-20 ENCOUNTER — Encounter (HOSPITAL_BASED_OUTPATIENT_CLINIC_OR_DEPARTMENT_OTHER): Payer: Self-pay

## 2023-02-20 ENCOUNTER — Emergency Department (HOSPITAL_BASED_OUTPATIENT_CLINIC_OR_DEPARTMENT_OTHER)
Admission: EM | Admit: 2023-02-20 | Discharge: 2023-02-20 | Disposition: A | Discharge status: Home / Self Care | Payer: Medicaid Other | Attending: Emergency Medicine | Admitting: Emergency Medicine

## 2023-02-20 DIAGNOSIS — M25511 Pain in right shoulder: Secondary | ICD-10-CM | POA: Insufficient documentation

## 2023-02-20 MED ORDER — NAPROXEN 500 MG PO TABS: 500.0000 mg | ORAL_TABLET | Freq: Two times a day (BID) | ORAL | 0 refills | Status: AC

## 2023-02-20 MED ORDER — KETOROLAC TROMETHAMINE 30 MG/ML IJ SOLN
30.0000 mg | Freq: Once | INTRAMUSCULAR | Status: AC
  Administered 2023-02-20: 30 mg via INTRAMUSCULAR
  Filled 2023-02-20: qty 1

## 2023-02-20 NOTE — Discharge Instructions (Signed)
You were seen today for shoulder pain.  Your x-ray does not show any evidence of fracture or dislocation.  You may have some arthritis.  Follow-up closely with orthopedics if pain continues.  Rest, and ice.  Take naproxen as needed for pain.

## 2023-02-20 NOTE — ED Provider Notes (Signed)
[place] EMERGENCY DEPARTMENT AT [facility] Provider Note   CSN: [id number] Arrival date & time: 02/20/23  0413     History  Chief Complaint  Patient presents with   Shoulder Pain    Johnny Turner is a 37 y.o. male.  HPI     This is a 37 year old male who presents with right shoulder pain.  Reports that pain started on Sunday morning.  He did drop some go carts on Saturday afternoon but did not note any specific injury.  His pain is mostly with lifting and abducting his right arm.  Denies numbness or tingling of that extremity.  He is right-handed.  Home Medications Prior to Admission medications   Medication Sig Start Date End Date Taking? Authorizing Provider  naproxen (NAPROSYN) 500 MG tablet Take 1 tablet (500 mg total) by mouth 2 (two) times daily. 02/20/23  Yes Daequan Kozma, Mayer Masker, MD  benzonatate (TESSALON) 100 MG capsule Take 1 capsule (100 mg total) by mouth every 8 (eight) hours. Patient not taking: Reported on 04/28/2021 04/03/12   Pisciotta, Joni Reining, PA-C  ondansetron (ZOFRAN) 4 MG tablet Take 1 tablet (4 mg total) by mouth every 6 (six) hours. 04/28/21   Fayrene Helper, PA-C  polyethylene glycol powder (GLYCOLAX/MIRALAX) 17 GM/SCOOP powder Take 17 g by mouth daily. 04/28/21   Fayrene Helper, PA-C      Allergies    Penicillins and Hydrocodone    Review of Systems   Review of Systems  Constitutional:  Negative for fever.  Musculoskeletal:        Shoulder pain  All other systems reviewed and are negative.   Physical Exam Updated Vital Signs BP 136/87   Pulse (!) 55   Temp 98.1 F (36.7 C) (Oral)   Resp 17   Ht 1.689 m (5' 6.5")   Wt 72.6 kg   SpO2 100%   BMI 25.44 kg/m  Physical Exam Vitals and nursing note reviewed.  Constitutional:      Appearance: He is well-developed. He is not ill-appearing.  HENT:     Head: Normocephalic and atraumatic.  Eyes:     Pupils: Pupils are equal, round, and reactive to light.  Cardiovascular:     Rate and Rhythm:  Normal rate and regular rhythm.  Pulmonary:     Effort: Pulmonary effort is normal. No respiratory distress.  Abdominal:     Palpations: Abdomen is soft.     Tenderness: There is no abdominal tenderness.  Musculoskeletal:     Cervical back: Neck supple.     Comments: Focused examination of the right arm with normal range of motion of the right arm, there is pain with abduction of the shoulder, no obvious deformities, shoulder appears located, strength intact, 2+ radial pulse  Lymphadenopathy:     Cervical: No cervical adenopathy.  Skin:    General: Skin is warm and dry.  Neurological:     Mental Status: He is alert and oriented to person, place, and time.  Psychiatric:        Mood and Affect: Mood normal.     ED Results / Procedures / Treatments   Labs (all labs ordered are listed, but only abnormal results are displayed) Labs Reviewed - No data to display  EKG None  Radiology No results found.  Procedures Procedures    Medications Ordered in ED Medications  ketorolac (TORADOL) 30 MG/ML injection 30 mg (30 mg Intramuscular Given 02/20/23 0442)    ED Course/ Medical Decision Making/ A&P  Medical Decision Making Amount and/or Complexity of Data Reviewed Radiology: ordered.  Risk Prescription drug management.   This patient presents to the ED for concern of shoulder pain, this involves an extensive number of treatment options, and is a complaint that carries with it a high risk of complications and morbidity.  I considered the following differential and admission for this acute, potentially life threatening condition.  The differential diagnosis includes fracture, dislocation, arthritis, overuse injury  MDM:    This is a 37 year old male who presents with concern for right shoulder pain he is overall nontoxic and vital signs are largely reassuring.  He has normal range of motion with but pain with abduction.  Shoulder appears located.   Low suspicion of fracture given mechanism.  I have reviewed his x-ray myself and do not see any obvious fracture or dislocation.  Will trial NSAIDs and have him follow-up with orthopedics.  (Labs, imaging, consults)  Labs: I Ordered, and personally interpreted labs.  The pertinent results include: None  Imaging Studies ordered: I ordered imaging studies including x-ray shoulder I independently visualized and interpreted imaging. I agree with the radiologist interpretation  Additional history obtained from chart review.  External records from outside source obtained and reviewed including prior evaluations  Cardiac Monitoring: The patient was maintained on a cardiac monitor.  If on the cardiac monitor, I personally viewed and interpreted the cardiac monitored which showed an underlying rhythm of: Sinus rhythm  Reevaluation: After the interventions noted above, I reevaluated the patient and found that they have :improved  Social Determinants of Health:  lives independently  Disposition: Discharge  Co morbidities that complicate the patient evaluation History reviewed. No pertinent past medical history.   Medicines Meds ordered this encounter  Medications   ketorolac (TORADOL) 30 MG/ML injection 30 mg   naproxen (NAPROSYN) 500 MG tablet    Sig: Take 1 tablet (500 mg total) by mouth 2 (two) times daily.    Dispense:  30 tablet    Refill:  0    I have reviewed the patients home medicines and have made adjustments as needed  Problem List / ED Course: Problem List Items Addressed This Visit   None Visit Diagnoses     Acute pain of right shoulder    -  Primary                   Final Clinical Impression(s) / ED Diagnoses Final diagnoses:  Acute pain of right shoulder    Rx / DC Orders ED Discharge Orders          Ordered    naproxen (NAPROSYN) 500 MG tablet  2 times daily        02/20/23 0504              Shon Baton, MD 02/20/23  [phone]

## 2023-02-20 NOTE — ED Triage Notes (Signed)
Pt reports going go-carting on Saturday and right shoulder feeling sore since then but that at 0600 yesterday when he lifted arm in the shower the pain instantly became severe. No improvement since then. No obvious deformity noted in triage.

## 2023-02-20 NOTE — ED Notes (Signed)
ED Provider at bedside. 

## 2023-05-25 ENCOUNTER — Encounter: Admitting: Orthopaedic Surgery

## 2023-06-01 ENCOUNTER — Encounter: Payer: Self-pay | Admitting: Orthopaedic Surgery

## 2023-06-01 ENCOUNTER — Ambulatory Visit: Admitting: Orthopaedic Surgery

## 2023-06-01 VITALS — BP 137/85 | HR 74 | Ht 66.0 in | Wt 165.0 lb

## 2023-06-01 DIAGNOSIS — M7541 Impingement syndrome of right shoulder: Secondary | ICD-10-CM

## 2023-06-01 MED ORDER — LIDOCAINE HCL 1 % IJ SOLN
0.5000 mL | INTRAMUSCULAR | Status: AC | PRN
  Administered 2023-06-01: .5 mL

## 2023-06-01 MED ORDER — METHYLPREDNISOLONE ACETATE 40 MG/ML IJ SUSP
40.0000 mg | INTRAMUSCULAR | Status: AC | PRN
  Administered 2023-06-01: 40 mg via INTRA_ARTICULAR

## 2023-06-01 MED ORDER — BUPIVACAINE HCL 0.25 % IJ SOLN
4.0000 mL | INTRAMUSCULAR | Status: AC | PRN
  Administered 2023-06-01: 4 mL via INTRA_ARTICULAR

## 2023-06-01 NOTE — Progress Notes (Signed)
 Office Visit Note   Patient: Johnny Turner           Date of Birth: June 24, 1985           MRN: [id number] Visit Date: 06/01/2023              Requested by: No referring provider defined for this encounter. PCP: Pcp, No   Assessment & Plan: Visit Diagnoses:  1. Impingement syndrome of right shoulder     Plan: Right subacromial injection performed which she tolerated well if he has persistent problems to call let us know and we can set him up with therapy.  Follow-Up Instructions: No follow-ups on file.   Orders:  Orders Placed This Encounter  Procedures   Large Joint Inj: R subacromial bursa   No orders of the defined types were placed in this encounter.     Procedures: Large Joint Inj: R subacromial bursa on 06/01/2023 2:13 PM Indications: pain Details: 22 G 1.5 in needle  Arthrogram: No  Medications: 4 mL bupivacaine 0.25 %; 40 mg methylPREDNISolone acetate 40 MG/ML; 0.5 mL lidocaine 1 % Outcome: tolerated well, no immediate complications Procedure, treatment alternatives, risks and benefits explained, specific risks discussed. Consent was given by the patient. Immediately prior to procedure a time out was called to verify the correct patient, procedure, equipment, support staff and site/side marked as required. Patient was prepped and draped in the usual sterile fashion.       Clinical Data: No additional findings.   Subjective: Chief Complaint  Patient presents with   Right Shoulder - Pain    HPI 38 year old male returns he was seen in the emergency room 02/20/2023 with x-rays of his shoulder with right shoulder pain.  Pain with abduction outstretched reaching and overhead activity.  He states he has to prop his arm up when he is in bed on a pillow.  Right-hand-dominant.  He has used ice 8 ibuprofen Naprosyn and Advil.  Radiographs were normal.  Denies neck pain no hand numbness.  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   Review of Systems all systems  noncontributory to HPI.   Objective: Vital Signs: BP 137/85   Pulse 74   Ht 5\' 6"  (1.676 m)   Wt 165 lb (74.8 kg)   BMI 26.63 kg/m   Physical Exam Constitutional:      Appearance: He is well-developed.  HENT:     Head: Normocephalic and atraumatic.     Right Ear: External ear normal.     Left Ear: External ear normal.  Eyes:     Pupils: Pupils are equal, round, and reactive to light.  Neck:     Thyroid: No thyromegaly.     Trachea: No tracheal deviation.  Cardiovascular:     Rate and Rhythm: Normal rate.  Pulmonary:     Effort: Pulmonary effort is normal.     Breath sounds: No wheezing.  Abdominal:     General: Bowel sounds are normal.     Palpations: Abdomen is soft.  Musculoskeletal:     Cervical back: Neck supple.  Skin:    General: Skin is warm and dry.     Capillary Refill: Capillary refill takes less than 2 seconds.  Neurological:     Mental Status: He is alert and oriented to person, place, and time.  Psychiatric:        Behavior: Behavior normal.        Thought Content: Thought content normal.  Judgment: Judgment normal.     Ortho Exam positive Neer right shoulder elbow reaches full extension reflexes normal no brachioplexus tenderness no atrophy long of the biceps is normal.  Opposite left shoulder shows no impingement.  He can get his arm up over his head easily easily on the left discomfort on the right and much more difficulty with abduction.  Specialty Comments:  No specialty comments available.  Imaging: No results found.   PMFS History: There are no active problems to display for this patient.  No past medical history on file.  No family history on file.  Past Surgical History:  Procedure Laterality Date   LEG SURGERY     Social History   Occupational History   Not on file  Tobacco Use   Smoking status: Never   Smokeless tobacco: Never  Vaping Use   Vaping status: Every Day  Substance and Sexual Activity   Alcohol use: Yes    Drug use: No   Sexual activity: Not on file

## 2023-09-11 ENCOUNTER — Ambulatory Visit: Admitting: Orthopaedic Surgery

## 2023-09-11 DIAGNOSIS — M25511 Pain in right shoulder: Secondary | ICD-10-CM | POA: Diagnosis not present

## 2023-09-11 DIAGNOSIS — G8929 Other chronic pain: Secondary | ICD-10-CM | POA: Diagnosis not present

## 2023-09-11 MED ORDER — TRAMADOL HCL 50 MG PO TABS: 50.0000 mg | ORAL_TABLET | Freq: Every day | ORAL | 0 refills | Status: DC | PRN

## 2023-09-11 MED ORDER — PREDNISONE 10 MG (21) PO TBPK: ORAL_TABLET | ORAL | 3 refills | Status: DC

## 2023-09-11 NOTE — Progress Notes (Signed)
 Office Visit Note   Patient: Johnny Turner           Date of Birth: [birth year]           MRN: [id number] Visit Date: 09/11/2023              Requested by: No referring provider defined for this encounter. PCP: Pcp, No   Assessment & Plan: Visit Diagnoses:  1. Chronic right shoulder pain     Plan: History of Present Illness Johnny Turner is a 38 year old male who presents with persistent right shoulder pain. He was referred by Dr. Barbarann for evaluation of persistent shoulder pain after an ineffective subacromial injection.  He experiences persistent pain in the right shoulder, localized to the side and top, in the joint area. The pain has been present for several months. A subacromial injection administered three months ago did not provide relief. He has not started physical therapy. No other joint pain is present, except for occasional sharp pain in the wrist.  Physical Exam MUSCULOSKELETAL: Right shoulder with good flexibility, pain at extremes of motion. Right shoulder rotator cuff strength intact.  No impingement signs.  Assessment and Plan Right shoulder pain Chronic right shoulder inflammation with pain. Previous subacromial injection ineffective. Generalized inflammation noted. Discussed intra-articular cortisone injection versus prednisone. Opted for prednisone. - Prescribe 6-day prednisone course. - Refer to physical therapy for shoulder rehabilitation. - Prescribe tramadol for pain management. - Order MRI if pain persists after six weeks of physical therapy.  Follow-Up Instructions: No follow-ups on file.   Orders:  Orders Placed This Encounter  Procedures   Ambulatory referral to Physical Therapy   Meds ordered this encounter  Medications   predniSONE (STERAPRED UNI-PAK 21 TAB) 10 MG (21) TBPK tablet    Sig: Take as directed    Dispense:  21 tablet    Refill:  3   traMADol (ULTRAM) 50 MG tablet    Sig: Take 1-2 tablets (50-100 mg total) by mouth daily as  needed.    Dispense:  20 tablet    Refill:  0   Subjective: Chief Complaint  Patient presents with   Right Shoulder - Pain    HPI  Review of Systems  Constitutional: Negative.   HENT: Negative.    Eyes: Negative.   Respiratory: Negative.    Cardiovascular: Negative.   Gastrointestinal: Negative.   Endocrine: Negative.   Genitourinary: Negative.   Skin: Negative.   Allergic/Immunologic: Negative.   Neurological: Negative.   Hematological: Negative.   Psychiatric/Behavioral: Negative.    All other systems reviewed and are negative.    Objective: Vital Signs: There were no vitals taken for this visit.  Physical Exam Vitals and nursing note reviewed.  Constitutional:      Appearance: He is well-developed.  HENT:     Head: Normocephalic and atraumatic.   Eyes:     Pupils: Pupils are equal, round, and reactive to light.   Pulmonary:     Effort: Pulmonary effort is normal.  Abdominal:     Palpations: Abdomen is soft.   Musculoskeletal:        General: Normal range of motion.     Cervical back: Neck supple.   Skin:    General: Skin is warm.   Neurological:     Mental Status: He is alert and oriented to person, place, and time.   Psychiatric:        Behavior: Behavior normal.        Thought Content:  Thought content normal.        Judgment: Judgment normal.      PMFS History: There are no active problems to display for this patient.  No past medical history on file.  No family history on file.  Past Surgical History:  Procedure Laterality Date   LEG SURGERY     Social History   Occupational History   Not on file  Tobacco Use   Smoking status: Never   Smokeless tobacco: Never  Vaping Use   Vaping status: Every Day  Substance and Sexual Activity   Alcohol use: Yes   Drug use: No   Sexual activity: Not on file

## 2023-09-12 ENCOUNTER — Other Ambulatory Visit: Payer: Self-pay

## 2023-09-12 ENCOUNTER — Telehealth: Payer: Self-pay | Admitting: Orthopaedic Surgery

## 2023-09-12 MED ORDER — PREDNISONE 10 MG (21) PO TBPK: ORAL_TABLET | ORAL | 3 refills | Status: AC

## 2023-09-12 NOTE — Telephone Encounter (Signed)
 Tried calling patient no answer. Voicemail not set up.

## 2023-09-12 NOTE — Telephone Encounter (Signed)
 Patient called and said that the medication was sent to the wrong pharmacy. It was supposed to be the [facility] on [address]. [phone]

## 2023-09-12 NOTE — Telephone Encounter (Signed)
 Tramadol was sent to correct pharm. Sent prednisone to correct pharm.

## 2023-09-21 ENCOUNTER — Emergency Department (HOSPITAL_BASED_OUTPATIENT_CLINIC_OR_DEPARTMENT_OTHER)
Admission: EM | Admit: 2023-09-21 | Discharge: 2023-09-21 | Disposition: A | Discharge status: Home / Self Care | Attending: Emergency Medicine | Admitting: Emergency Medicine

## 2023-09-21 ENCOUNTER — Encounter (HOSPITAL_BASED_OUTPATIENT_CLINIC_OR_DEPARTMENT_OTHER): Payer: Self-pay

## 2023-09-21 ENCOUNTER — Other Ambulatory Visit: Payer: Self-pay

## 2023-09-21 ENCOUNTER — Emergency Department (HOSPITAL_BASED_OUTPATIENT_CLINIC_OR_DEPARTMENT_OTHER)

## 2023-09-21 DIAGNOSIS — S66107A Unspecified injury of flexor muscle, fascia and tendon of left little finger at wrist and hand level, initial encounter: Secondary | ICD-10-CM | POA: Insufficient documentation

## 2023-09-21 DIAGNOSIS — T148XXA Other injury of unspecified body region, initial encounter: Secondary | ICD-10-CM | POA: Diagnosis not present

## 2023-09-21 DIAGNOSIS — S6992XA Unspecified injury of left wrist, hand and finger(s), initial encounter: Secondary | ICD-10-CM | POA: Diagnosis present

## 2023-09-21 NOTE — Discharge Instructions (Addendum)
 Your x-ray today does not show any fracture or dislocation.  You have a tendon injury in your pinky finger which is why it is stuck in the bent position.  You have been placed into a finger splint to hold the tendon in the right position.  Please keep this splint clean and dry.  Wrap the splint with a garbage bag or Saran wrap to keep it dry while showering or bathing.  Do not take off the splint.  Keep the splint on at all times until cleared by the hand doctor.  You may take up to 1000mg  of tylenol  every 6 hours as needed for pain. Do not take more then 4g per day.   You may use up to 600mg  ibuprofen  every 6 hours as needed for pain.  Do not exceed 2.4g of ibuprofen  per day.  Call the hand doctor, Dr. Lorretta, listed below to schedule a follow-up appointment within the next 1-2 weeks  Return to the ER for any uncontrolled pain, numbness or tingling, discoloration, any other new or concerning symptoms.
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
## 2023-09-30 NOTE — Therapy (Deleted)
 OUTPATIENT PHYSICAL THERAPY SHOULDER EVALUATION   Patient Name: Johnny Turner MRN: [id number] DOB:1986/02/03, 38 y.o., male Today's Date: 09/30/2023  END OF SESSION:   No past medical history on file. Past Surgical History:  Procedure Laterality Date   arm surgery Right    LEG SURGERY     There are no active problems to display for this patient.   PCP: none  REFERRING PROVIDER: Dr. Jerri   REFERRING DIAG: [phone] (ICD-10-CM) - Chronic right shoulder pain   THERAPY DIAG:  No diagnosis found.  Rationale for Evaluation and Treatment: Rehabilitation  ONSET DATE: ***  SUBJECTIVE:                                                                                                                                                                                      SUBJECTIVE STATEMENT: *** Hand dominance: {MISC; OT HAND DOMINANCE:[phone]}  PERTINENT HISTORY:  He was referred by Dr. Barbarann for evaluation of persistent shoulder pain after an ineffective subacromial injection.   He experiences persistent pain in the right shoulder, localized to the side and top, in the joint area. The pain has been present for several months. A subacromial injection administered three months ago did not provide relief. He has not started physical therapy. No other joint pain is present, except for occasional sharp pain in the wrist.   Physical Exam MUSCULOSKELETAL: Right shoulder with good flexibility, pain at extremes of motion. Right shoulder rotator cuff strength intact.  No impingement signs.   Assessment and Plan Right shoulder pain Chronic right shoulder inflammation with pain. Previous subacromial injection ineffective. Generalized inflammation noted. Discussed intra-articular cortisone injection versus prednisone . Opted for prednisone . - Prescribe 6-day prednisone  course. - Refer to physical therapy for shoulder rehabilitation. - Prescribe tramadol  for pain management. - Order MRI  if pain persists after six weeks of physical therapy.   Follow-Up Instructions: No follow-ups on file.   PAIN:  Are you having pain? Yes: NPRS scale: *** Pain location: *** Pain description: *** Aggravating factors: *** Relieving factors: ***  PRECAUTIONS: None  RED FLAGS: None   WEIGHT BEARING RESTRICTIONS: No  FALLS:  Has patient fallen in last 6 months? No  LIVING ENVIRONMENT: Lives with: {[facility] lives with:25569::lives with their family} Lives in: {Lives in:25570} Stairs: {opstairs:27293} Has following equipment at home: {Assistive devices:23999}  OCCUPATION: ***  PLOF: Independent  PATIENT GOALS:***  NEXT MD VISIT:   OBJECTIVE:  Note: Objective measures were completed at Evaluation unless otherwise noted.  DIAGNOSTIC FINDINGS:  There is no evidence of fracture or dislocation. There is no evidence of arthropathy or other focal bone abnormality. Soft tissues are unremarkable.  PATIENT SURVEYS:  {rehab  surveys:24030:a}  COGNITION: Overall cognitive status: {cognition:24006}     SENSATION: {sensation:27233}  POSTURE: ***  UPPER EXTREMITY ROM:   {AROM/PROM:27142} ROM Right eval Left eval  Shoulder flexion    Shoulder extension    Shoulder abduction    Shoulder adduction    Shoulder internal rotation    Shoulder external rotation    Elbow flexion    Elbow extension    Wrist flexion    Wrist extension    Wrist ulnar deviation    Wrist radial deviation    Wrist pronation    Wrist supination    (Blank rows = not tested)  UPPER EXTREMITY MMT:  MMT Right eval Left eval  Shoulder flexion    Shoulder extension    Shoulder abduction    Shoulder adduction    Shoulder internal rotation    Shoulder external rotation    Middle trapezius    Lower trapezius    Elbow flexion    Elbow extension    Wrist flexion    Wrist extension    Wrist ulnar deviation    Wrist radial deviation    Wrist pronation    Wrist supination    Grip strength  (lbs)    (Blank rows = not tested)  SHOULDER SPECIAL TESTS: Impingement tests: {shoulder impingement test:25231:a} SLAP lesions: {SLAP lesions:25232} Instability tests: {shoulder instability test:25233} Rotator cuff assessment: {rotator cuff assessment:25234} Biceps assessment: {biceps assessment:25235}  JOINT MOBILITY TESTING:  ***  PALPATION:  ***                                                                                                                             TREATMENT DATE: ***   PATIENT EDUCATION: Education details: *** Person educated: {Person educated:25204} Education method: {Education Method:25205} Education comprehension: {Education Comprehension:25206}  HOME EXERCISE PROGRAM: ***  ASSESSMENT:  CLINICAL IMPRESSION: Patient is a *** y.o. *** who was seen today for physical therapy evaluation and treatment for ***.   OBJECTIVE IMPAIRMENTS: {opptimpairments:25111}.   ACTIVITY LIMITATIONS: {activitylimitations:27494}  PARTICIPATION LIMITATIONS: {participationrestrictions:25113}  PERSONAL FACTORS: {Personal factors:25162} are also affecting patient's functional outcome.   REHAB POTENTIAL: {rehabpotential:25112}  CLINICAL DECISION MAKING: {clinical decision making:25114}  EVALUATION COMPLEXITY: {Evaluation complexity:25115}   GOALS: Goals reviewed with patient? {yes/no:20286}  SHORT TERM GOALS: Target date: ***  *** Baseline: Goal status: INITIAL  2.  *** Baseline:  Goal status: INITIAL  3.  *** Baseline:  Goal status: INITIAL  4.  *** Baseline:  Goal status: INITIAL  5.  *** Baseline:  Goal status: INITIAL  6.  *** Baseline:  Goal status: INITIAL  LONG TERM GOALS: Target date: ***  *** Baseline:  Goal status: INITIAL  2.  *** Baseline:  Goal status: INITIAL  3.  *** Baseline:  Goal status: INITIAL  4.  *** Baseline:  Goal status: INITIAL  5.  *** Baseline:  Goal status: INITIAL  6.  *** Baseline:  Goal  status: INITIAL  PLAN:  PT FREQUENCY: {  rehab frequency:25116}  PT DURATION: {rehab duration:25117}  PLANNED INTERVENTIONS: {rehab planned interventions:25118::97110-Therapeutic exercises,97530- Therapeutic [phone]- Neuromuscular re-education,97535- Self Rjmz,02859- Manual therapy}  PLAN FOR NEXT SESSION: ***   Lety Cullens, PT 09/30/2023, 8:44 PM

## 2023-10-01 ENCOUNTER — Ambulatory Visit: Admitting: Physical Therapy

## 2023-10-08 NOTE — Therapy (Signed)
 OUTPATIENT PHYSICAL THERAPY SHOULDER EVALUATION   Patient Name: Johnny Turner MRN: [id number] DOB:August 30, 1985, 38 y.o., male Today's Date: 10/09/2023  END OF SESSION:  PT End of Session - 10/09/23 1220     Visit Number 1    Authorization Type MCD    PT Start Time 1215    PT Stop Time 1300    PT Time Calculation (min) 45 min    Activity Tolerance Patient tolerated treatment well    Behavior During Therapy [facility] for tasks assessed/performed          History reviewed. No pertinent past medical history. Past Surgical History:  Procedure Laterality Date   arm surgery Right    LEG SURGERY     There are no active problems to display for this patient.   PCP: Pcp, No   REFERRING PROVIDER: Jerri Kay HERO, MD  REFERRING DIAG: [phone] (ICD-10-CM) - Chronic right shoulder pain  THERAPY DIAG:  Chronic right shoulder pain - Plan: PT plan of care cert/re-cert  Biceps tendinitis of right shoulder  Muscle weakness (generalized)  Rationale for Evaluation and Treatment: Rehabilitation  ONSET DATE: chronic  SUBJECTIVE:                                                                                                                                                                                      SUBJECTIVE STATEMENT: He experiences persistent pain in the right shoulder, localized to the side and top, in the joint area. The pain has been present for several months. A subacromial injection administered three months ago did not provide relief. He has not started physical therapy. No other joint pain is present, except for occasional sharp pain in the wrist. Hand dominance: Right  PERTINENT HISTORY: Right shoulder pain Chronic right shoulder inflammation with pain. Previous subacromial injection ineffective. Generalized inflammation noted. Discussed intra-articular cortisone injection versus prednisone . Opted for prednisone . - Prescribe 6-day prednisone  course. - Refer to  physical therapy for shoulder rehabilitation. - Prescribe tramadol  for pain management. - Order MRI if pain persists after six weeks of physical therapy.  PAIN:  Are you having pain? Yes: NPRS scale: 8/10 Pain location: R shoulder Pain description: ache, sharp Aggravating factors: OH motion Relieving factors: tramadol , icing  PRECAUTIONS: None  RED FLAGS: None   WEIGHT BEARING RESTRICTIONS: No  FALLS:  Has patient fallen in last 6 months? No  OCCUPATION: not working  PLOF: Independent  PATIENT GOALS:To manage my shoulder pain  NEXT MD VISIT:   OBJECTIVE:  Note: Objective measures were completed at Evaluation unless otherwise noted.  DIAGNOSTIC FINDINGS:  None recent  PATIENT SURVEYS:  Quick Dash: 38/55 63% perceived disability  Minimally Clinically  Important Difference (MCID): 15-20 points  (Franchignoni, F. et al. (2013). Minimally clinically important difference of the disabilities of the arm, shoulder, and hand outcome measures (DASH) and its shortened version (Quick DASH). Journal of Orthopaedic & Sports Physical Therapy, 44(1), 30-39)    POSTURE: Rounded shoulders  UPPER EXTREMITY ROM:   A/PROM Right eval Left eval  Shoulder flexion 90/WNL   Shoulder extension    Shoulder abduction 90/WNL   Shoulder adduction    Shoulder internal rotation /WNL   Shoulder external rotation /WNL   Elbow flexion    Elbow extension    Wrist flexion    Wrist extension    Wrist ulnar deviation    Wrist radial deviation    Wrist pronation    Wrist supination    (Blank rows = not tested)  UPPER EXTREMITY MMT:  MMT Right eval Left eval  Shoulder flexion 4-   Shoulder extension 4-   Shoulder abduction 4-   Shoulder adduction    Shoulder internal rotation 4-   Shoulder external rotation 4-   Middle trapezius    Lower trapezius    Elbow flexion    Elbow extension    Wrist flexion    Wrist extension    Wrist ulnar deviation    Wrist radial deviation     Wrist pronation    Wrist supination    Grip strength (lbs)    (Blank rows = not tested)  SHOULDER SPECIAL TESTS: Impingement tests: Neer impingement test: negative, Hawkins/Kennedy impingement test: negative, and O' Briens test: positive  Rotator cuff assessment: Empty can test: negative and Hornblower's sign: negative   PALPATION:  TTP R biceps tendon, AC/CC ligament                                                                                                                             TREATMENT:    PATIENT EDUCATION: Education details: Discussed eval findings, rehab rationale and POC and patient is in agreement  Person educated: Patient Education method: Explanation and Handouts Education comprehension: verbalized understanding and needs further education  HOME EXERCISE PROGRAM: Access Code: [email or web address] URL: [email or web address] Date: 10/09/2023 Prepared by: Priya Matsen  Exercises - Shoulder External Rotation and Scapular Retraction with Resistance  - 2 x daily - 5 x weekly - 2 sets - 15 reps - Supine Shoulder Horizontal Abduction with Resistance  - 2 x daily - 5 x weekly - 2 sets - 15 reps - Standing Bilateral Low Shoulder Row with Anchored Resistance  - 2 x daily - 5 x weekly - 2 sets - 15 reps  ASSESSMENT:  CLINICAL IMPRESSION: Patient is a 38 y.o. male  who was seen today for physical therapy evaluation and treatment for chronic R shoulder pain.  Patient presents with rounded shoulder posturing  and inability to flex or abduct R arm past shoulder height.  TTP noted at biceps tendon, AC/CC ligaments and biceps tendon.  RC testing uncomfortable ut RC appears intact.  Patient presents with S&S of biceps tendinitis soft tissue inflammation of anterior shoulder complicated by rounde shoulder posturing.  qDASH score scores 63% perceived disability  OBJECTIVE IMPAIRMENTS: decreased activity tolerance, decreased knowledge of condition, decreased mobility,  decreased ROM, decreased strength, impaired perceived functional ability, impaired UE functional use, postural dysfunction, and pain.   ACTIVITY LIMITATIONS: carrying, lifting, and sleeping  PERSONAL FACTORS: Age, Behavior pattern, Fitness, and Time since onset of injury/illness/exacerbation are also affecting patient's functional outcome.   REHAB POTENTIAL: Good  CLINICAL DECISION MAKING: Stable/uncomplicated  EVALUATION COMPLEXITY: Low   GOALS: Goals reviewed with patient? No  SHORT TERM GOALS: Target date: 10/30/2023  Patient to demonstrate independence in HEP  Baseline: AJXQPCRM Goal status: INITIAL  2.  Patient to demo proper posture when cued Baseline: Rounded shoulders Goal status: INITIAL   LONG TERM GOALS: Target date: 12/04/2023  Patient will acknowledge 4/10 pain at least once during episode of care   Baseline: 8/10 Goal status: INITIAL  2.  Patient will score at least 23/55 on QDASH to signify clinically meaningful improvement in functional abilities.   Baseline: 38/55 Goal status: INITIAL  3.  Increase AROM of R shoulder to 160d flexion and abduction Baseline: 90d due to pain Goal status: INITIAL  4.  4/5 R shoulder strength Baseline:  MMT Right eval Left eval  Shoulder flexion 4-   Shoulder extension 4-   Shoulder abduction 4-   Shoulder adduction    Shoulder internal rotation 4-   Shoulder external rotation 4-    Goal status: INITIAL   PLAN:  PT FREQUENCY: 1-2x/week  PT DURATION: 6 weeks  PLANNED INTERVENTIONS: 97110-Therapeutic exercises, 97530- Therapeutic activity, 97112- Neuromuscular re-education, 97535- Self Care, 02859- Manual therapy, and Patient/Family education  PLAN FOR NEXT SESSION: HEP review and update, manual techniques as appropriate, aerobic tasks, ROM and flexibility activities, strengthening and PREs, TPDN, gait and balance training as needed    For all possible CPT codes, reference the Planned Interventions line above.      Check all conditions that are expected to impact treatment: {Conditions expected to impact treatment:None of these apply   If treatment provided at initial evaluation, no treatment charged due to lack of authorization.       Neaveh Belanger M Skyler Carel, PT 10/09/2023, 1:04 PM

## 2023-10-09 ENCOUNTER — Ambulatory Visit: Attending: Orthopaedic Surgery

## 2023-10-09 ENCOUNTER — Other Ambulatory Visit: Payer: Self-pay

## 2023-10-09 DIAGNOSIS — M25511 Pain in right shoulder: Secondary | ICD-10-CM | POA: Diagnosis present

## 2023-10-09 DIAGNOSIS — M6281 Muscle weakness (generalized): Secondary | ICD-10-CM | POA: Diagnosis present

## 2023-10-09 DIAGNOSIS — G8929 Other chronic pain: Secondary | ICD-10-CM | POA: Diagnosis present

## 2023-10-09 DIAGNOSIS — M7521 Bicipital tendinitis, right shoulder: Secondary | ICD-10-CM | POA: Insufficient documentation

## 2023-10-10 NOTE — Therapy (Deleted)
 OUTPATIENT PHYSICAL THERAPY SHOULDER EVALUATION   Patient Name: Johnny Turner MRN: [id number] DOB:[birth year], 38 y.o., male Today's Date: 10/10/2023  END OF SESSION:    No past medical history on file. Past Surgical History:  Procedure Laterality Date   arm surgery Right    LEG SURGERY     There are no active problems to display for this patient.   PCP: Pcp, No   REFERRING PROVIDER: Jerri Kay HERO, MD  REFERRING DIAG: [phone] (ICD-10-CM) - Chronic right shoulder pain  THERAPY DIAG:  No diagnosis found.  Rationale for Evaluation and Treatment: Rehabilitation  ONSET DATE: chronic  SUBJECTIVE:                                                                                                                                                                                      SUBJECTIVE STATEMENT: He experiences persistent pain in the right shoulder, localized to the side and top, in the joint area. The pain has been present for several months. A subacromial injection administered three months ago did not provide relief. He has not started physical therapy. No other joint pain is present, except for occasional sharp pain in the wrist. Hand dominance: Right  PERTINENT HISTORY: Right shoulder pain Chronic right shoulder inflammation with pain. Previous subacromial injection ineffective. Generalized inflammation noted. Discussed intra-articular cortisone injection versus prednisone . Opted for prednisone . - Prescribe 6-day prednisone  course. - Refer to physical therapy for shoulder rehabilitation. - Prescribe tramadol  for pain management. - Order MRI if pain persists after six weeks of physical therapy.  PAIN:  Are you having pain? Yes: NPRS scale: 8/10 Pain location: R shoulder Pain description: ache, sharp Aggravating factors: OH motion Relieving factors: tramadol , icing  PRECAUTIONS: None  RED FLAGS: None   WEIGHT BEARING RESTRICTIONS: No  FALLS:  Has  patient fallen in last 6 months? No  OCCUPATION: not working  PLOF: Independent  PATIENT GOALS:To manage my shoulder pain  NEXT MD VISIT:   OBJECTIVE:  Note: Objective measures were completed at Evaluation unless otherwise noted.  DIAGNOSTIC FINDINGS:  None recent  PATIENT SURVEYS:  Quick Dash: 38/55 63% perceived disability  Minimally Clinically Important Difference (MCID): 15-20 points  (Franchignoni, F. et al. (2013). Minimally clinically important difference of the disabilities of the arm, shoulder, and hand outcome measures (DASH) and its shortened version (Quick DASH). Journal of Orthopaedic & Sports Physical Therapy, 44(1), 30-39)    POSTURE: Rounded shoulders  UPPER EXTREMITY ROM:   A/PROM Right eval Left eval  Shoulder flexion 90/WNL   Shoulder extension    Shoulder abduction 90/WNL   Shoulder adduction    Shoulder internal rotation /WNL   Shoulder external  rotation /WNL   Elbow flexion    Elbow extension    Wrist flexion    Wrist extension    Wrist ulnar deviation    Wrist radial deviation    Wrist pronation    Wrist supination    (Blank rows = not tested)  UPPER EXTREMITY MMT:  MMT Right eval Left eval  Shoulder flexion 4-   Shoulder extension 4-   Shoulder abduction 4-   Shoulder adduction    Shoulder internal rotation 4-   Shoulder external rotation 4-   Middle trapezius    Lower trapezius    Elbow flexion    Elbow extension    Wrist flexion    Wrist extension    Wrist ulnar deviation    Wrist radial deviation    Wrist pronation    Wrist supination    Grip strength (lbs)    (Blank rows = not tested)  SHOULDER SPECIAL TESTS: Impingement tests: Neer impingement test: negative, Hawkins/Kennedy impingement test: negative, and O' Briens test: positive  Rotator cuff assessment: Empty can test: negative and Hornblower's sign: negative   PALPATION:  TTP R biceps tendon, AC/CC ligament                                                                                                                              TREATMENT:    PATIENT EDUCATION: Education details: Discussed eval findings, rehab rationale and POC and patient is in agreement  Person educated: Patient Education method: Explanation and Handouts Education comprehension: verbalized understanding and needs further education  HOME EXERCISE PROGRAM: Access Code: [email or web address] URL: [email or web address] Date: 10/09/2023 Prepared by: Evart Mcdonnell  Exercises - Shoulder External Rotation and Scapular Retraction with Resistance  - 2 x daily - 5 x weekly - 2 sets - 15 reps - Supine Shoulder Horizontal Abduction with Resistance  - 2 x daily - 5 x weekly - 2 sets - 15 reps - Standing Bilateral Low Shoulder Row with Anchored Resistance  - 2 x daily - 5 x weekly - 2 sets - 15 reps  ASSESSMENT:  CLINICAL IMPRESSION: Patient is a 38 y.o. male  who was seen today for physical therapy evaluation and treatment for chronic R shoulder pain.  Patient presents with rounded shoulder posturing  and inability to flex or abduct R arm past shoulder height.  TTP noted at biceps tendon, AC/CC ligaments and biceps tendon.  RC testing uncomfortable ut RC appears intact.  Patient presents with S&S of biceps tendinitis soft tissue inflammation of anterior shoulder complicated by rounde shoulder posturing.  qDASH score scores 63% perceived disability  OBJECTIVE IMPAIRMENTS: decreased activity tolerance, decreased knowledge of condition, decreased mobility, decreased ROM, decreased strength, impaired perceived functional ability, impaired UE functional use, postural dysfunction, and pain.   ACTIVITY LIMITATIONS: carrying, lifting, and sleeping  PERSONAL FACTORS: Age, Behavior pattern, Fitness, and Time since onset of injury/illness/exacerbation are also affecting patient's functional outcome.   REHAB POTENTIAL: Good  CLINICAL  DECISION MAKING: Stable/uncomplicated  EVALUATION  COMPLEXITY: Low   GOALS: Goals reviewed with patient? No  SHORT TERM GOALS: Target date: 10/30/2023  Patient to demonstrate independence in HEP  Baseline: AJXQPCRM Goal status: INITIAL  2.  Patient to demo proper posture when cued Baseline: Rounded shoulders Goal status: INITIAL   LONG TERM GOALS: Target date: 12/04/2023  Patient will acknowledge 4/10 pain at least once during episode of care   Baseline: 8/10 Goal status: INITIAL  2.  Patient will score at least 23/55 on QDASH to signify clinically meaningful improvement in functional abilities.   Baseline: 38/55 Goal status: INITIAL  3.  Increase AROM of R shoulder to 160d flexion and abduction Baseline: 90d due to pain Goal status: INITIAL  4.  4/5 R shoulder strength Baseline:  MMT Right eval Left eval  Shoulder flexion 4-   Shoulder extension 4-   Shoulder abduction 4-   Shoulder adduction    Shoulder internal rotation 4-   Shoulder external rotation 4-    Goal status: INITIAL   PLAN:  PT FREQUENCY: 1-2x/week  PT DURATION: 6 weeks  PLANNED INTERVENTIONS: 97110-Therapeutic exercises, 97530- Therapeutic activity, 97112- Neuromuscular re-education, 97535- Self Care, 02859- Manual therapy, and Patient/Family education  PLAN FOR NEXT SESSION: HEP review and update, manual techniques as appropriate, aerobic tasks, ROM and flexibility activities, strengthening and PREs, TPDN, gait and balance training as needed    For all possible CPT codes, reference the Planned Interventions line above.     Check all conditions that are expected to impact treatment: {Conditions expected to impact treatment:None of these apply   If treatment provided at initial evaluation, no treatment charged due to lack of authorization.       Jameria Bradway M Lucius Wise, PT 10/10/2023, 1:25 PM

## 2023-10-11 ENCOUNTER — Telehealth: Payer: Self-pay

## 2023-10-11 ENCOUNTER — Ambulatory Visit

## 2023-10-11 NOTE — Telephone Encounter (Signed)
 TC due to missed visit.  VM not setup, unable to leave message.

## 2023-10-16 ENCOUNTER — Telehealth: Payer: Self-pay | Admitting: Internal Medicine

## 2023-10-16 ENCOUNTER — Other Ambulatory Visit: Payer: Self-pay | Admitting: Physician Assistant

## 2023-10-16 MED ORDER — TRAMADOL HCL 50 MG PO TABS: 50.0000 mg | ORAL_TABLET | Freq: Every day | ORAL | 0 refills | Status: DC | PRN

## 2023-10-16 NOTE — Telephone Encounter (Signed)
 Pt called requesting a refill of tramadol . Please send to [facility] [address]. Pt phone number is [phone].

## 2023-10-16 NOTE — Telephone Encounter (Signed)
 Called and notified patient.

## 2023-10-16 NOTE — Telephone Encounter (Signed)
 sent

## 2023-10-17 ENCOUNTER — Encounter: Admitting: Physical Therapy

## 2023-10-19 ENCOUNTER — Encounter: Admitting: Physical Therapy

## 2023-10-23 ENCOUNTER — Ambulatory Visit

## 2023-10-24 NOTE — Therapy (Deleted)
 OUTPATIENT PHYSICAL THERAPY SHOULDER EVALUATION   Patient Name: Johnny Turner MRN: [id number] DOB:28-Sep-1985, 38 y.o., male Today's Date: 10/24/2023  END OF SESSION:    No past medical history on file. Past Surgical History:  Procedure Laterality Date   arm surgery Right    LEG SURGERY     There are no active problems to display for this patient.   PCP: Pcp, No   REFERRING PROVIDER: Jerri Kay HERO, MD  REFERRING DIAG: [phone] (ICD-10-CM) - Chronic right shoulder pain  THERAPY DIAG:  No diagnosis found.  Rationale for Evaluation and Treatment: Rehabilitation  ONSET DATE: chronic  SUBJECTIVE:                                                                                                                                                                                      SUBJECTIVE STATEMENT: He experiences persistent pain in the right shoulder, localized to the side and top, in the joint area. The pain has been present for several months. A subacromial injection administered three months ago did not provide relief. He has not started physical therapy. No other joint pain is present, except for occasional sharp pain in the wrist. Hand dominance: Right  PERTINENT HISTORY: Right shoulder pain Chronic right shoulder inflammation with pain. Previous subacromial injection ineffective. Generalized inflammation noted. Discussed intra-articular cortisone injection versus prednisone . Opted for prednisone . - Prescribe 6-day prednisone  course. - Refer to physical therapy for shoulder rehabilitation. - Prescribe tramadol  for pain management. - Order MRI if pain persists after six weeks of physical therapy.  PAIN:  Are you having pain? Yes: NPRS scale: 8/10 Pain location: R shoulder Pain description: ache, sharp Aggravating factors: OH motion Relieving factors: tramadol , icing  PRECAUTIONS: None  RED FLAGS: None   WEIGHT BEARING RESTRICTIONS: No  FALLS:  Has  patient fallen in last 6 months? No  OCCUPATION: not working  PLOF: Independent  PATIENT GOALS:To manage my shoulder pain  NEXT MD VISIT:   OBJECTIVE:  Note: Objective measures were completed at Evaluation unless otherwise noted.  DIAGNOSTIC FINDINGS:  None recent  PATIENT SURVEYS:  Quick Dash: 38/55 63% perceived disability  Minimally Clinically Important Difference (MCID): 15-20 points  (Franchignoni, F. et al. (2013). Minimally clinically important difference of the disabilities of the arm, shoulder, and hand outcome measures (DASH) and its shortened version (Quick DASH). Journal of Orthopaedic & Sports Physical Therapy, 44(1), 30-39)    POSTURE: Rounded shoulders  UPPER EXTREMITY ROM:   A/PROM Right eval Left eval  Shoulder flexion 90/WNL   Shoulder extension    Shoulder abduction 90/WNL   Shoulder adduction    Shoulder internal rotation /WNL   Shoulder external  rotation /WNL   Elbow flexion    Elbow extension    Wrist flexion    Wrist extension    Wrist ulnar deviation    Wrist radial deviation    Wrist pronation    Wrist supination    (Blank rows = not tested)  UPPER EXTREMITY MMT:  MMT Right eval Left eval  Shoulder flexion 4-   Shoulder extension 4-   Shoulder abduction 4-   Shoulder adduction    Shoulder internal rotation 4-   Shoulder external rotation 4-   Middle trapezius    Lower trapezius    Elbow flexion    Elbow extension    Wrist flexion    Wrist extension    Wrist ulnar deviation    Wrist radial deviation    Wrist pronation    Wrist supination    Grip strength (lbs)    (Blank rows = not tested)  SHOULDER SPECIAL TESTS: Impingement tests: Neer impingement test: negative, Hawkins/Kennedy impingement test: negative, and O' Briens test: positive  Rotator cuff assessment: Empty can test: negative and Hornblower's sign: negative   PALPATION:  TTP R biceps tendon, AC/CC ligament                                                                                                                              TREATMENT:    PATIENT EDUCATION: Education details: Discussed eval findings, rehab rationale and POC and patient is in agreement  Person educated: Patient Education method: Explanation and Handouts Education comprehension: verbalized understanding and needs further education  HOME EXERCISE PROGRAM: Access Code: [email or web address] URL: [email or web address] Date: 10/09/2023 Prepared by: Toshie Demelo  Exercises - Shoulder External Rotation and Scapular Retraction with Resistance  - 2 x daily - 5 x weekly - 2 sets - 15 reps - Supine Shoulder Horizontal Abduction with Resistance  - 2 x daily - 5 x weekly - 2 sets - 15 reps - Standing Bilateral Low Shoulder Row with Anchored Resistance  - 2 x daily - 5 x weekly - 2 sets - 15 reps  ASSESSMENT:  CLINICAL IMPRESSION: Patient is a 38 y.o. male  who was seen today for physical therapy evaluation and treatment for chronic R shoulder pain.  Patient presents with rounded shoulder posturing  and inability to flex or abduct R arm past shoulder height.  TTP noted at biceps tendon, AC/CC ligaments and biceps tendon.  RC testing uncomfortable ut RC appears intact.  Patient presents with S&S of biceps tendinitis soft tissue inflammation of anterior shoulder complicated by rounde shoulder posturing.  qDASH score scores 63% perceived disability  OBJECTIVE IMPAIRMENTS: decreased activity tolerance, decreased knowledge of condition, decreased mobility, decreased ROM, decreased strength, impaired perceived functional ability, impaired UE functional use, postural dysfunction, and pain.   ACTIVITY LIMITATIONS: carrying, lifting, and sleeping  PERSONAL FACTORS: Age, Behavior pattern, Fitness, and Time since onset of injury/illness/exacerbation are also affecting patient's functional outcome.   REHAB POTENTIAL: Good  CLINICAL  DECISION MAKING: Stable/uncomplicated  EVALUATION  COMPLEXITY: Low   GOALS: Goals reviewed with patient? No  SHORT TERM GOALS: Target date: 10/30/2023  Patient to demonstrate independence in HEP  Baseline: [email or web address] Goal status: INITIAL  2.  Patient to demo proper posture when cued Baseline: Rounded shoulders Goal status: INITIAL   LONG TERM GOALS: Target date: 12/04/2023  Patient will acknowledge 4/10 pain at least once during episode of care   Baseline: 8/10 Goal status: INITIAL  2.  Patient will score at least 23/55 on QDASH to signify clinically meaningful improvement in functional abilities.   Baseline: 38/55 Goal status: INITIAL  3.  Increase AROM of R shoulder to 160d flexion and abduction Baseline: 90d due to pain Goal status: INITIAL  4.  4/5 R shoulder strength Baseline:  MMT Right eval Left eval  Shoulder flexion 4-   Shoulder extension 4-   Shoulder abduction 4-   Shoulder adduction    Shoulder internal rotation 4-   Shoulder external rotation 4-    Goal status: INITIAL   PLAN:  PT FREQUENCY: 1-2x/week  PT DURATION: 6 weeks  PLANNED INTERVENTIONS: 97110-Therapeutic exercises, 97530- Therapeutic activity, 97112- Neuromuscular re-education, 97535- Self Care, 02859- Manual therapy, and Patient/Family education  PLAN FOR NEXT SESSION: HEP review and update, manual techniques as appropriate, aerobic tasks, ROM and flexibility activities, strengthening and PREs, TPDN, gait and balance training as needed    For all possible CPT codes, reference the Planned Interventions line above.     Check all conditions that are expected to impact treatment: {Conditions expected to impact treatment:None of these apply   If treatment provided at initial evaluation, no treatment charged due to lack of authorization.       Merlene Dante M Albaraa Swingle, PT 10/24/2023, 9:42 AM

## 2023-10-26 ENCOUNTER — Ambulatory Visit: Attending: Orthopaedic Surgery

## 2023-10-26 ENCOUNTER — Telehealth: Payer: Self-pay

## 2023-10-26 NOTE — Telephone Encounter (Signed)
 TC due to missed visit.  Spoke directly to patient informing him of missed visit as well as attendance policy.  Reminded of next appointment date and time.

## 2023-10-31 ENCOUNTER — Ambulatory Visit: Admitting: Physical Therapy

## 2023-10-31 ENCOUNTER — Encounter

## 2023-10-31 NOTE — Therapy (Incomplete)
 OUTPATIENT PHYSICAL THERAPY TREATMENT NOTE    Patient Name: Johnny Turner MRN: [id number] DOB:1986/02/20, 38 y.o., male Today's Date: 10/31/2023  END OF SESSION:    No past medical history on file. Past Surgical History:  Procedure Laterality Date   arm surgery Right    LEG SURGERY     There are no active problems to display for this patient.   PCP: Pcp, No   REFERRING PROVIDER: Jerri Kay HERO, MD  REFERRING DIAG: [phone] (ICD-10-CM) - Chronic right shoulder pain  THERAPY DIAG:  No diagnosis found.  Rationale for Evaluation and Treatment: Rehabilitation  ONSET DATE: chronic  SUBJECTIVE:                                                                                                                                                                                      SUBJECTIVE STATEMENT: ***  EVAL: He experiences persistent pain in the right shoulder, localized to the side and top, in the joint area. The pain has been present for several months. A subacromial injection administered three months ago did not provide relief. He has not started physical therapy. No other joint pain is present, except for occasional sharp pain in the wrist. Hand dominance: Right  PERTINENT HISTORY: Right shoulder pain Chronic right shoulder inflammation with pain. Previous subacromial injection ineffective. Generalized inflammation noted. Discussed intra-articular cortisone injection versus prednisone . Opted for prednisone . - Prescribe 6-day prednisone  course. - Refer to physical therapy for shoulder rehabilitation. - Prescribe tramadol  for pain management. - Order MRI if pain persists after six weeks of physical therapy.  PAIN:  Are you having pain? Yes: NPRS scale: 8/10 Pain location: R shoulder Pain description: ache, sharp Aggravating factors: OH motion Relieving factors: tramadol , icing  PRECAUTIONS: None  RED FLAGS: None   WEIGHT BEARING RESTRICTIONS: No  FALLS:   Has patient fallen in last 6 months? No  OCCUPATION: not working  PLOF: Independent  PATIENT GOALS:To manage my shoulder pain  NEXT MD VISIT:   OBJECTIVE:  Note: Objective measures were completed at Evaluation unless otherwise noted.  DIAGNOSTIC FINDINGS:  None recent  PATIENT SURVEYS:  Quick Dash: 38/55 63% perceived disability  Minimally Clinically Important Difference (MCID): 15-20 points  (Franchignoni, F. et al. (2013). Minimally clinically important difference of the disabilities of the arm, shoulder, and hand outcome measures (DASH) and its shortened version (Quick DASH). Journal of Orthopaedic & Sports Physical Therapy, 44(1), 30-39)    POSTURE: Rounded shoulders  UPPER EXTREMITY ROM:   A/PROM Right eval Left eval  Shoulder flexion 90/WNL   Shoulder extension    Shoulder abduction 90/WNL   Shoulder adduction    Shoulder internal rotation CARLEENE  Shoulder external rotation /WNL   Elbow flexion    Elbow extension    Wrist flexion    Wrist extension    Wrist ulnar deviation    Wrist radial deviation    Wrist pronation    Wrist supination    (Blank rows = not tested)  UPPER EXTREMITY MMT:  MMT Right eval Left eval  Shoulder flexion 4-   Shoulder extension 4-   Shoulder abduction 4-   Shoulder adduction    Shoulder internal rotation 4-   Shoulder external rotation 4-   Middle trapezius    Lower trapezius    Elbow flexion    Elbow extension    Wrist flexion    Wrist extension    Wrist ulnar deviation    Wrist radial deviation    Wrist pronation    Wrist supination    Grip strength (lbs)    (Blank rows = not tested)  SHOULDER SPECIAL TESTS: Impingement tests: Neer impingement test: negative, Hawkins/Kennedy impingement test: negative, and O' Briens test: positive  Rotator cuff assessment: Empty can test: negative and Hornblower's sign: negative   PALPATION:  TTP R biceps tendon, AC/CC ligament                                                                                                                              TREATMENT:  [facility] Adult PT Treatment:                                                DATE: 10/31/23 Therapeutic Exercise: Supine horizontal abduction RTB 2x10 Supine diagonals RTB 2x10 Pulleys for ROM flexion/scaption x2' ea Neuromuscular re-ed: Rows GTB 2x10 - cues for posture Shoulder extension GTB 2x10 - cues for posture Seated BIL ER with cues for scap retraction RTB 2x10 Seated ITYs no weight - cues for posture ER/IR RUE only RTB 2x10 ea    PATIENT EDUCATION: Education details: Discussed eval findings, rehab rationale and POC and patient is in agreement  Person educated: Patient Education method: Explanation and Handouts Education comprehension: verbalized understanding and needs further education  HOME EXERCISE PROGRAM: Access Code: [email or web address] URL: [email or web address] Date: 10/09/2023 Prepared by: Reyes Kohut  Exercises - Shoulder External Rotation and Scapular Retraction with Resistance  - 2 x daily - 5 x weekly - 2 sets - 15 reps - Supine Shoulder Horizontal Abduction with Resistance  - 2 x daily - 5 x weekly - 2 sets - 15 reps - Standing Bilateral Low Shoulder Row with Anchored Resistance  - 2 x daily - 5 x weekly - 2 sets - 15 reps  ASSESSMENT:  CLINICAL IMPRESSION: ***  EVAL: Patient is a 38 y.o. male  who was seen today for physical therapy evaluation and treatment for chronic R shoulder pain.  Patient presents with rounded shoulder posturing  and  inability to flex or abduct R arm past shoulder height.  TTP noted at biceps tendon, AC/CC ligaments and biceps tendon.  RC testing uncomfortable ut RC appears intact.  Patient presents with S&S of biceps tendinitis soft tissue inflammation of anterior shoulder complicated by rounde shoulder posturing.  qDASH score scores 63% perceived disability  OBJECTIVE IMPAIRMENTS: decreased activity tolerance, decreased knowledge of  condition, decreased mobility, decreased ROM, decreased strength, impaired perceived functional ability, impaired UE functional use, postural dysfunction, and pain.   ACTIVITY LIMITATIONS: carrying, lifting, and sleeping  PERSONAL FACTORS: Age, Behavior pattern, Fitness, and Time since onset of injury/illness/exacerbation are also affecting patient's functional outcome.   REHAB POTENTIAL: Good  CLINICAL DECISION MAKING: Stable/uncomplicated  EVALUATION COMPLEXITY: Low   GOALS: Goals reviewed with patient? No  SHORT TERM GOALS: Target date: 10/30/2023  Patient to demonstrate independence in HEP  Baseline: AJXQPCRM Goal status: INITIAL  2.  Patient to demo proper posture when cued Baseline: Rounded shoulders Goal status: INITIAL   LONG TERM GOALS: Target date: 12/04/2023  Patient will acknowledge 4/10 pain at least once during episode of care   Baseline: 8/10 Goal status: INITIAL  2.  Patient will score at least 23/55 on QDASH to signify clinically meaningful improvement in functional abilities.   Baseline: 38/55 Goal status: INITIAL  3.  Increase AROM of R shoulder to 160d flexion and abduction Baseline: 90d due to pain Goal status: INITIAL  4.  4/5 R shoulder strength Baseline:  MMT Right eval Left eval  Shoulder flexion 4-   Shoulder extension 4-   Shoulder abduction 4-   Shoulder adduction    Shoulder internal rotation 4-   Shoulder external rotation 4-    Goal status: INITIAL   PLAN:  PT FREQUENCY: 1-2x/week  PT DURATION: 6 weeks  PLANNED INTERVENTIONS: 97110-Therapeutic exercises, 97530- Therapeutic activity, 97112- Neuromuscular re-education, 97535- Self Care, 02859- Manual therapy, and Patient/Family education  PLAN FOR NEXT SESSION: HEP review and update, manual techniques as appropriate, aerobic tasks, ROM and flexibility activities, strengthening and PREs, TPDN, gait and balance training as needed    For all possible CPT codes, reference the  Planned Interventions line above.     Check all conditions that are expected to impact treatment: {Conditions expected to impact treatment:None of these apply   If treatment provided at initial evaluation, no treatment charged due to lack of authorization.       Corean Pouch, PTA 10/31/2023, 10:33 AM

## 2023-11-02 ENCOUNTER — Encounter: Admitting: Physical Therapy

## 2023-11-06 ENCOUNTER — Encounter: Admitting: Physical Therapy

## 2023-11-08 ENCOUNTER — Encounter: Admitting: Physical Therapy

## 2023-11-28 ENCOUNTER — Telehealth: Payer: Self-pay | Admitting: Orthopaedic Surgery

## 2023-11-28 ENCOUNTER — Other Ambulatory Visit: Payer: Self-pay | Admitting: Physician Assistant

## 2023-11-28 MED ORDER — TRAMADOL HCL 50 MG PO TABS: 50.0000 mg | ORAL_TABLET | Freq: Every day | ORAL | 0 refills | Status: AC | PRN

## 2023-11-28 NOTE — Telephone Encounter (Signed)
 Pt called requesting refill of tramadol . Please send to [facility] on [address]. Pt phone number is [phone].

## 2023-11-28 NOTE — Telephone Encounter (Signed)
 Called. No answer. LMOM that medication has been sent in. MRI is recommended, if he if okay with moving forward with an MRI he is to call us  back.

## 2023-11-28 NOTE — Telephone Encounter (Signed)
 Sent, but according to xu's note, looks like he needs mri if still in pain

## 2023-11-28 NOTE — Telephone Encounter (Signed)
 Patient called back and said he would like to move forward with the MRI. CB#[phone]

## 2023-11-29 ENCOUNTER — Other Ambulatory Visit: Payer: Self-pay

## 2023-11-29 DIAGNOSIS — G8929 Other chronic pain: Secondary | ICD-10-CM

## 2023-11-29 DIAGNOSIS — M7541 Impingement syndrome of right shoulder: Secondary | ICD-10-CM

## 2023-11-29 NOTE — Telephone Encounter (Signed)
 MRI has been ordered

## 2023-12-10 ENCOUNTER — Telehealth: Payer: Self-pay | Admitting: Orthopedic Surgery

## 2023-12-10 ENCOUNTER — Other Ambulatory Visit: Payer: Self-pay | Admitting: Physician Assistant

## 2023-12-10 MED ORDER — DIAZEPAM 2 MG PO TABS: ORAL_TABLET | ORAL | 0 refills | Status: AC

## 2023-12-10 NOTE — Telephone Encounter (Signed)
 Please send Rx into pharm. Thanks.

## 2023-12-10 NOTE — Telephone Encounter (Signed)
 Johnny Turner states that he has an MRI scheduled for tomorrow.  He is claustrophobic and will require medication before his scan.  He uses the [facility] on [address]. He can be reached at [phone].

## 2023-12-10 NOTE — Telephone Encounter (Signed)
 Called and notified patient.

## 2023-12-10 NOTE — Telephone Encounter (Signed)
 sent

## 2023-12-11 ENCOUNTER — Other Ambulatory Visit

## 2024-01-21 ENCOUNTER — Encounter: Payer: Self-pay | Admitting: Radiology

## 2024-03-23 ENCOUNTER — Emergency Department (HOSPITAL_BASED_OUTPATIENT_CLINIC_OR_DEPARTMENT_OTHER): Admitting: Radiology

## 2024-03-23 ENCOUNTER — Encounter (HOSPITAL_BASED_OUTPATIENT_CLINIC_OR_DEPARTMENT_OTHER): Payer: Self-pay

## 2024-03-23 ENCOUNTER — Other Ambulatory Visit: Payer: Self-pay

## 2024-03-23 ENCOUNTER — Emergency Department (HOSPITAL_BASED_OUTPATIENT_CLINIC_OR_DEPARTMENT_OTHER): Admission: EM | Admit: 2024-03-23 | Source: Home / Self Care

## 2024-03-23 DIAGNOSIS — M25571 Pain in right ankle and joints of right foot: Secondary | ICD-10-CM | POA: Diagnosis present

## 2024-03-23 DIAGNOSIS — W208XXA Other cause of strike by thrown, projected or falling object, initial encounter: Secondary | ICD-10-CM | POA: Diagnosis not present

## 2024-03-23 NOTE — ED Triage Notes (Signed)
 Pt from home via POV.  Pt states a lamp fell from a height of approximately 6 feet and landed on back of R ankle.  Pt reports pain is 7/10.  Decreased ROM in R ankle and foot r/t pain.  Dorsalis pulse +2.  Minor swelling noted in R ankle.

## 2024-03-24 ENCOUNTER — Emergency Department (HOSPITAL_BASED_OUTPATIENT_CLINIC_OR_DEPARTMENT_OTHER): Admitting: Radiology

## 2024-03-24 DIAGNOSIS — M25571 Pain in right ankle and joints of right foot: Secondary | ICD-10-CM

## 2024-03-24 MED ORDER — ACETAMINOPHEN 500 MG PO TABS
1000.0000 mg | ORAL_TABLET | Freq: Once | ORAL | Status: AC
  Administered 2024-03-24: 1000 mg via ORAL
  Filled 2024-03-24: qty 2

## 2024-03-24 MED ORDER — KETOROLAC TROMETHAMINE 15 MG/ML IJ SOLN
30.0000 mg | Freq: Once | INTRAMUSCULAR | Status: AC
  Administered 2024-03-24: 30 mg via INTRAMUSCULAR
  Filled 2024-03-24: qty 2

## 2024-03-24 MED ORDER — CELECOXIB 200 MG PO CAPS: 200.0000 mg | ORAL_CAPSULE | Freq: Two times a day (BID) | ORAL | 0 refills | Status: AC

## 2024-03-24 NOTE — ED Provider Notes (Signed)
 " [place] [facility] AT [facility] Provider Note   CSN: [id number] Arrival date & time: 03/23/24  2251     History Chief Complaint  Patient presents with   Ankle Pain    HPI Johnny Turner is a 39 y.o. male presenting for states that a piece of a lamp fell on his leg at about .   Patient's recorded medical, surgical, social, medication list and allergies were reviewed in the Snapshot window as part of the initial history.   Review of Systems   Review of Systems  Constitutional:  Negative for chills and fever.  HENT:  Negative for ear pain and sore throat.   Eyes:  Negative for pain and visual disturbance.  Respiratory:  Negative for cough and shortness of breath.   Cardiovascular:  Negative for chest pain and palpitations.  Gastrointestinal:  Negative for abdominal pain and vomiting.  Genitourinary:  Negative for dysuria and hematuria.  Musculoskeletal:  Negative for arthralgias and back pain.  Skin:  Negative for color change and rash.  Neurological:  Negative for seizures and syncope.  All other systems reviewed and are negative.   Physical Exam Updated Vital Signs BP (!) 149/95   Pulse 89   Temp 98.7 F (37.1 C) (Oral)   Resp 18   Ht 5' 6 (1.676 m)   Wt 72.6 kg   SpO2 99%   BMI 25.82 kg/m  Physical Exam Vitals and nursing note reviewed.  Constitutional:      General: He is not in acute distress.    Appearance: He is well-developed.  HENT:     Head: Normocephalic and atraumatic.  Eyes:     Conjunctiva/sclera: Conjunctivae normal.  Cardiovascular:     Rate and Rhythm: Normal rate and regular rhythm.     Heart sounds: No murmur heard. Pulmonary:     Effort: Pulmonary effort is normal. No respiratory distress.     Breath sounds: Normal breath sounds.  Abdominal:     Palpations: Abdomen is soft.     Tenderness: There is no abdominal tenderness.  Musculoskeletal:        General: No swelling.     Cervical back: Neck supple.  Skin:     General: Skin is warm and dry.     Capillary Refill: Capillary refill takes less than 2 seconds.  Neurological:     Mental Status: He is alert.  Psychiatric:        Mood and Affect: Mood normal.      ED Course/ Medical Decision Making/ A&P    Procedures Procedures   Medications Ordered in ED Medications  acetaminophen  (TYLENOL ) tablet 1,000 mg (has no administration in time range)  ketorolac  (TORADOL ) 15 MG/ML injection 30 mg (has no administration in time range)    Medical Decision Making:   Fall event. Ankle pain. XR negative.  Exam benign. No Lis franc pattern on exam CAM/Toradol /Tylenol  Follow up with podiatry/ortho   Disposition:  I have considered need for hospitalization, however, considering all of the above, I believe this patient is stable for discharge at this time.  Patient/family educated about specific return precautions for given chief complaint and symptoms.  Patient/family educated about follow-up with PCP and podiatry.     Patient/family expressed understanding of return precautions and need for follow-up. Patient spoken to regarding all imaging and laboratory results and appropriate follow up for these results. All education provided in verbal form with additional information in written form. Time was allowed for answering of patient  questions. Patient discharged.    Emergency Department Medication Summary:   Medications  acetaminophen  (TYLENOL ) tablet 1,000 mg (has no administration in time range)  ketorolac  (TORADOL ) 15 MG/ML injection 30 mg (has no administration in time range)        Clinical Impression:  1. Acute right ankle pain      Discharge   Final Clinical Impression(s) / ED Diagnoses Final diagnoses:  Acute right ankle pain    Rx / DC Orders ED Discharge Orders          Ordered    celecoxib  (CELEBREX ) 200 MG capsule  2 times daily        03/24/24 0419              Jerral Meth, MD 03/24/24 0422  "
